# Patient Record
Sex: Female | Born: 1955 | Race: White | Hispanic: No | Marital: Married
Health system: Southern US, Community
[De-identification: ages and names within clinical notes are randomized; demographics above are authoritative.]

---

## 2021-10-28 ENCOUNTER — Inpatient Hospital Stay
Admission: AD | Admit: 2021-10-28 | Discharge: 2021-11-24 | Disposition: A | Discharge status: Skilled Nursing Facility | Payer: Medicare Other | Source: Other Acute Inpatient Hospital

## 2021-10-28 ENCOUNTER — Other Ambulatory Visit (HOSPITAL_COMMUNITY): Payer: Medicare Other

## 2021-10-28 DIAGNOSIS — J90 Pleural effusion, not elsewhere classified: Secondary | ICD-10-CM

## 2021-10-28 DIAGNOSIS — J9621 Acute and chronic respiratory failure with hypoxia: Secondary | ICD-10-CM

## 2021-10-28 DIAGNOSIS — Z931 Gastrostomy status: Secondary | ICD-10-CM

## 2021-10-28 DIAGNOSIS — Z930 Tracheostomy status: Secondary | ICD-10-CM

## 2021-10-28 DIAGNOSIS — G061 Intraspinal abscess and granuloma: Secondary | ICD-10-CM

## 2021-10-28 DIAGNOSIS — J9690 Respiratory failure, unspecified, unspecified whether with hypoxia or hypercapnia: Secondary | ICD-10-CM

## 2021-10-28 DIAGNOSIS — J449 Chronic obstructive pulmonary disease, unspecified: Secondary | ICD-10-CM

## 2021-10-28 DIAGNOSIS — Z4659 Encounter for fitting and adjustment of other gastrointestinal appliance and device: Secondary | ICD-10-CM

## 2021-10-28 LAB — VANCOMYCIN, TROUGH: Vancomycin Tr: 21 ug/mL (ref 15–20)

## 2021-10-28 IMAGING — DX DG CHEST 1V PORT
1 series · 1 of 1 positions shown · non-contrast
Comparison: None.

CLINICAL DATA: Respiratory failure.

EXAM:
PORTABLE CHEST 1 VIEW

[chest ap]
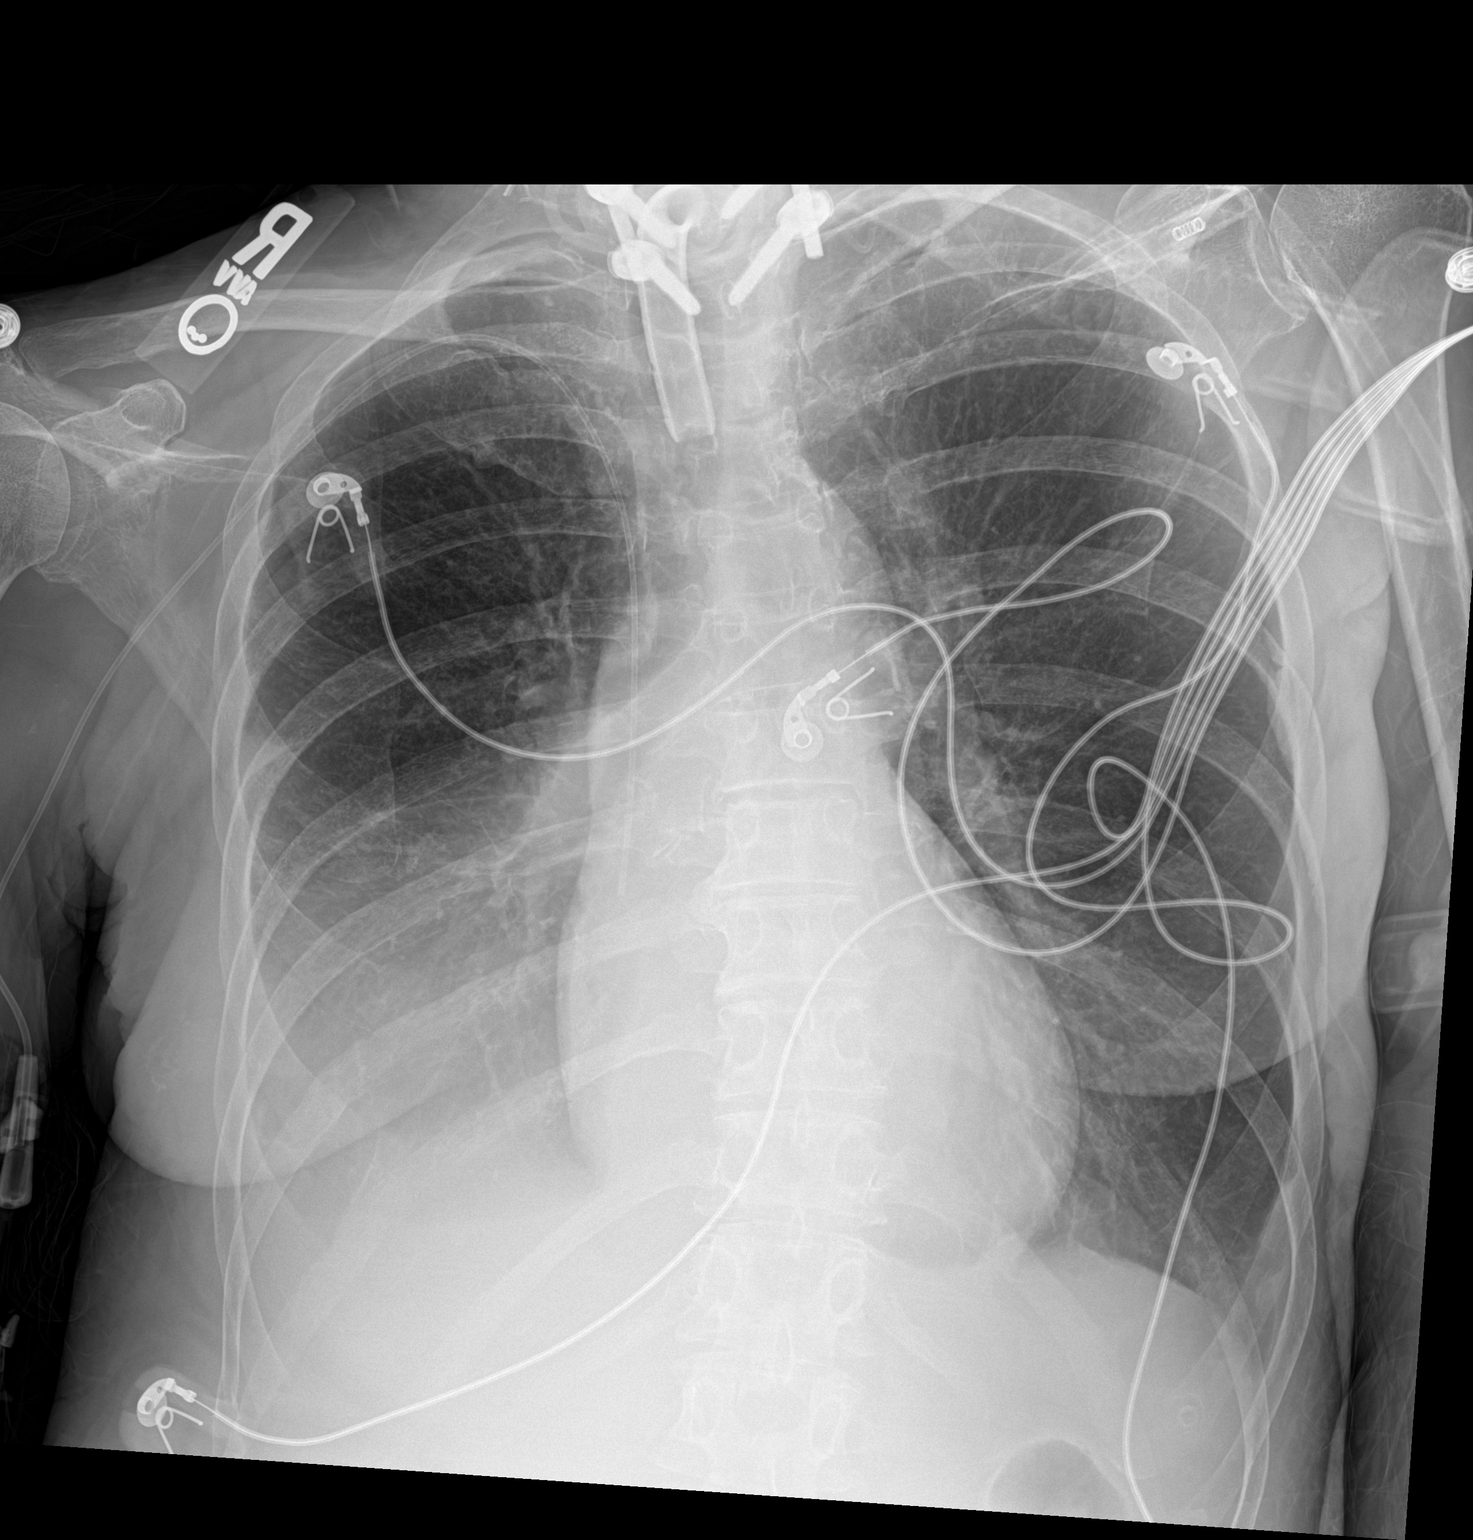

[1 of 1 positions shown; findings below may reference images not displayed]

FINDINGS: Right upper extremity PICC terminates over the distal SVC. The tip
of the tracheostomy is 4 cm above the carina. There is a small left
and small to moderate size right pleural effusion. The lungs are
otherwise clear. There is no pneumothorax. Cardiomediastinal
silhouette is within normal limits. There are surgical changes in
the cervical spine and neck.
IMPRESSION: 1. Small left and small/moderate right pleural effusions.

## 2021-10-28 MED ORDER — DIATRIZOATE MEGLUMINE & SODIUM 66-10 % PO SOLN
ORAL | Status: AC
  Administered 2021-10-28: 30 mL via GASTROSTOMY
  Filled 2021-10-28: qty 30

## 2021-10-29 DIAGNOSIS — J449 Chronic obstructive pulmonary disease, unspecified: Secondary | ICD-10-CM | POA: Diagnosis not present

## 2021-10-29 DIAGNOSIS — J9621 Acute and chronic respiratory failure with hypoxia: Secondary | ICD-10-CM | POA: Diagnosis not present

## 2021-10-29 DIAGNOSIS — J90 Pleural effusion, not elsewhere classified: Secondary | ICD-10-CM

## 2021-10-29 DIAGNOSIS — G061 Intraspinal abscess and granuloma: Secondary | ICD-10-CM

## 2021-10-29 DIAGNOSIS — Z930 Tracheostomy status: Secondary | ICD-10-CM

## 2021-10-29 LAB — COMPREHENSIVE METABOLIC PANEL
ALT: 21 U/L (ref 0–44)
AST: 21 U/L (ref 15–41)
Albumin: 1.8 g/dL — ABNORMAL LOW (ref 3.5–5.0)
Alkaline Phosphatase: 60 U/L (ref 38–126)
Anion gap: 4 — ABNORMAL LOW (ref 5–15)
BUN: 34 mg/dL — ABNORMAL HIGH (ref 8–23)
CO2: 30 mmol/L (ref 22–32)
Calcium: 8.9 mg/dL (ref 8.9–10.3)
Chloride: 111 mmol/L (ref 98–111)
Creatinine, Ser: 0.45 mg/dL (ref 0.44–1.00)
GFR, Estimated: >60 mL/min (ref >60)
Glucose, Bld: 120 mg/dL — ABNORMAL HIGH (ref 70–99)
Potassium: 3.9 mmol/L (ref 3.5–5.1)
Sodium: 145 mmol/L (ref 135–145)
Total Bilirubin: 0.3 mg/dL (ref 0.3–1.2)
Total Protein: 5.7 g/dL — ABNORMAL LOW (ref 6.5–8.1)

## 2021-10-29 LAB — CBC WITH DIFFERENTIAL/PLATELET
Abs Immature Granulocytes: 0.02 10*3/uL (ref 0.00–0.07)
Basophils Absolute: 0 10*3/uL (ref 0.0–0.1)
Basophils Relative: 1 %
Eosinophils Absolute: 0.3 10*3/uL (ref 0.0–0.5)
Eosinophils Relative: 5 %
HCT: 31.4 % — ABNORMAL LOW (ref 36.0–46.0)
Hemoglobin: 9.8 g/dL — ABNORMAL LOW (ref 12.0–15.0)
Immature Granulocytes: 0 %
Lymphocytes Relative: 9 %
Lymphs Abs: 0.5 10*3/uL — ABNORMAL LOW (ref 0.7–4.0)
MCH: 30.9 pg (ref 26.0–34.0)
MCHC: 31.2 g/dL (ref 30.0–36.0)
MCV: 99.1 fL (ref 80.0–100.0)
Monocytes Absolute: 0.5 10*3/uL (ref 0.1–1.0)
Monocytes Relative: 9 %
Neutro Abs: 3.9 10*3/uL (ref 1.7–7.7)
Neutrophils Relative %: 76 %
Platelets: 185 10*3/uL (ref 150–400)
RBC: 3.17 MIL/uL — ABNORMAL LOW (ref 3.87–5.11)
RDW: 17.2 % — ABNORMAL HIGH (ref 11.5–15.5)
WBC: 5.2 10*3/uL (ref 4.0–10.5)
nRBC: 0 % (ref 0.0–0.2)

## 2021-10-29 LAB — TSH: TSH: 2.546 u[IU]/mL (ref 0.350–4.500)

## 2021-10-29 NOTE — Consult Note (Signed)
[facility] [place]  Date of Service: 10/29/2021  PULMONARY CRITICAL CARE CONSULT   Lori LeanJanet Laverdure Kemp  YQI:[id number]  DOB: [birth year]   DOA: 10/28/2021  Referring Physician: Luna KitchensStephanie Brown, MD  HPI: Lori Kemp is a 66 y.o. female seen for follow up of Acute on Chronic Respiratory Failure.  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  Patient was taken to the OR on November 26 with a C2-7 laminectomy and occiput to T2 posterior fusion.  Patient November 29 had a C3-C4 ACDF with evacuation of an epidural abscess.  Patient's hospital course was very complicated and required ongoing mechanical ventilation was not able to come off the ventilator so transferred to our facility for further management.  At the time of evaluation patient is on assist control with an FiO2 of 40%.  Patient apparently had a right vertebral artery injury which was unchanged but there was adequate collateral circulation.  As far as the infection is concerned patient was treated with vancomycin cefepime Flagyl  Review of Systems:  ROS performed and is unremarkable other than noted above.  Past Medical History: Past Medical History:  Diagnosis Date   Alcoholism (CMS-HCC)   Anxiety   Arthritis   Cancer (CMS-HCC)   COPD (chronic obstructive pulmonary disease) (CMS-HCC)   Depression   Emphysema of lung (CMS-HCC) 2021   Feeding by G-tube (CMS-HCC)   GERD (gastroesophageal reflux disease)   Headache   History of smoking 25-50 pack years   Hx of Lymphoma of breast (CMS-HCC)   Hypocalciuria   Low calcium levels   Lymphoma (CMS-HCC)  basal cell   Macular hole, left   Oropharyngeal cancer (CMS-HCC)  hx 2020   Osteoporosis   Peptic  ulceration 1995   Pneumonia 11/2019   Sleep apnea  doesn't use a CPAP   Stroke (CMS-HCC)  about 13 yrs ago, "mild"   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)   Surgical History: Past Surgical History:  Procedure Laterality Date   ADENOIDECTOMY   COLONOSCOPY   DILATION AND CURETTAGE OF UTERUS   ESOPHAGOGASTRODUODENOSCOPY   IR INSERT G-TUBE PERCUTANEOUS 02/11/2019  IR INSERT G-TUBE PERCUTANEOUS 02/11/2019 Rush BarerJessica Kelly Stewart, MD IMG [facility]IR [facility]   IR INSERT G-TUBE PERCUTANEOUS 08/21/2019  IR INSERT G-TUBE PERCUTANEOUS 08/21/2019 Braulio Contelayton Warren Commander, MD IMG [facility]   PORT A CATH PLACEMENT [facility] HISTORICAL RESULT)   PORT A CATH REMOVAL [facility] HISTORICAL RESULT)   PR ADJ TISS XFER HEAD,FAC,HAND <10 SQCM Left 07/12/2021  Procedure: ADJACENT TRANSF CHIN/NECK/AX/FT; 10 SQ CM/LESS; Surgeon: Karrie MeresWendell Gray Yarbrough, MD; [facility]; Service: ENT   PR ALLOGRAFT FOR SPINE SURGERY ONLY MORSELIZED N/A 09/02/2021  Procedure: Jena GaussALLOGRAFT FOR SPINE SURGERY ONLY; MORSELIZED; Surgeon: Bretta BangBrian David Sindelar, MD; Location: MAIN OR [facility]; Service: Neurosurgery   PR ARTHRD PST/PSTLAT TQ 1NTRSPC CRV BELW C2 SEGMENT N/A 09/02/2021  Procedure: ARTHRODESIS, POSTERIOR OR POSTEROLATERAL TECHNIQUE, SINGLE INTERSPACE; CERVICAL BELOW C2 SEGMENT; Surgeon: Bretta BangBrian David Sindelar, MD; [facility]; Service: Neurosurgery   PR ARTHRODESIS ANT INTERBODY MIN DISCECTOMY,EA ADDL N/A 09/06/2021  Procedure: ARTHRODESIS, ANTERIOR INTERBODY TECHNIQUE, INCLUD MINIMAL DISKECTOMY TO PREP INTERSPAC; EA ADD`L INTERSPACE CERVICAL; Surgeon: Bretta BangBrian David Sindelar, MD; [facility]; Service: Neurosurgery   PR ARTHRODESIS POSTERIOR CRANIOCERVICAL N/A 09/02/2021  Procedure: ARTHRODESIS, POSTERIOR TECHNIQUE, CRANIOCERVICAL (  OCCIPUT-C2); Surgeon: Bretta Bang, MD; Location: [facility] OR [facility]; Service: Neurosurgery   PR ARTHRODESIS PST/PSTLAT TQ 1NTRSPC EA ADDL NTRSPC N/A 09/02/2021  Procedure: ARTHRODESIS,  POSTERIOR OR POSTEROLATERAL TECHNIQUE, SINGLE INTERSPACE; EACH ADDITIONAL INTERSPACE (LIST SEPARATELY IN ADDITION TO CODE FOR PRIMARY PROCEDURE); Surgeon: Bretta Bang, MD; Location[facility] OR [facility]; Service: Neurosurgery   PR AUTOGRAFT SPINE SURGERY LOCAL FROM SAME INCISION N/A 09/02/2021  Procedure: AUTOGRAFT/SPINE SURG ONLY (W/HARVEST GRAFT); LOCAL (EG, RIB/SPINOUS PROC, LAM FRGMT) OBTAIN FROM SAME INCIS; Surgeon: Bretta Bang, MD; Location: [facility] OR [facility]; Service: Neurosurgery   PR BRONCHOSCOPY,DIAGNOSTIC N/A 08/19/2019  Procedure: BRONCHOSCOPY, RIGID OR FLEXIBLE, W/WO FLUOROSCOPIC GUIDANCE; DIAGNOSTIC, WITH CELL WASHING, WHEN PERFORMED; Surgeon: Karrie Meres, MD; Location: MAIN OR [facility]; Service: ENT   PR ESOPHAGOSCOPY,DIAGNOSTIC N/A 08/19/2019  Procedure: ESOPHAGOSCOPY, RIGID OR FLEXIBLE; DIAGNOSTIC, W/WO COLLECTION OF SPECIMEN(S) BY BRUSHING OR WASHING; Surgeon: Karrie Meres, MD; Location: MAIN OR [facility]; Service: ENT   PR EXPLORATION N/FLWD SURG NECK ARTERY N/A 09/09/2019  Procedure: EXPLORATION NOT FOLLOWED BY SURGICAL REPAIR, ARTERY; NECK (EG, CAROTID, SUBCLAVIAN); Surgeon: Biagio Quint, MD; Location: MAIN OR [facility]; Service: ENT   PR FREE SKIN FLAP W MICROVASC ANAST N/A 09/09/2019  Procedure: FREE SKIN FLAP WITH MICROVASCULAR ANASTOMOSIS; Surgeon: Biagio Quint, MD; Location: [facility] OR [facility]; Service: ENT   PR LAM FACETECTOMY&FORAMOT 1 VRT SGM EA ADDL SGM N/A 09/02/2021  Procedure: LAMINECTOMY, FACETECTOMY AND FORAMINOTOMY (UNI/BI W DECOMP OF SPINAL CORD, CAUDA EQUINA AND/ NERVE ROOT'S), SNGLE VERTEBRAL SEG; EACH ADDTL VERTEBRAL SEG, CERVICAL, THORACIC, OR LUM (ADD TO PRIM PROC); Surgeon: Bretta Bang, MD; Location: [facility] OR [facility]; Service: Neurosurgery   PR LAMINEC/FACETECT/FORAMIN,CERVICAL 1 SEG N/A 09/02/2021  Procedure: LAMINECTOMY SNGL VERTEBRAL SEGMT-UNI/BIL; CERV; Surgeon: Bretta Bang, MD; Location: MAIN OR [facility]; Service:  Neurosurgery   PR LARYNGOSCOPY,DIRCT,OP SCOPE,BIOPSY N/A 08/19/2019  Procedure: LARYNGOSCOPY, DIRECT, OPERATIVE, WITH BIOPSY; WITH OPERATING MICROSCOPE OR TELESCOPE; Surgeon: Karrie Meres, MD; Location: MAIN OR [facility]; Service: ENT   PR LARYNGOSCOPY,DIRCT,OP SCOPE,BIOPSY N/A 09/09/2019  Procedure: LARYNGOSCOPY, DIRECT, OPERATIVE, WITH BIOPSY; WITH OPERATING MICROSCOPE OR TELESCOPE; Surgeon: Karrie Meres, MD; Location: [facility]; Service: ENT   PR PART REMOVAL TONGUE,<1/2 Left 07/12/2021  Procedure: ROBOTIC GLOSSECTOMY; LESS THAN ONE-HALF TONGUE; Surgeon: Karrie Meres, MD; Location: MAIN OR [facility]; Service: ENT   PR PARTIAL REMOVAL OF PHARYNX Left 07/12/2021  Procedure: LTD PHARYNGECTOMY; Surgeon: Karrie Meres, MD; Location: MAIN OR [facility]; Service: ENT   PR POSTERIOR SEGMENTAL INSTRUMENTATION 7-12 VRT SEG N/A 09/02/2021  Procedure: POSTERIOR SEGMENTAL INSTRUMENTATION; (EG, PEDICLE FIXATION, DUAL RODS W/MULT HOOKS/WIRES) 7-12 VERTEB SEGMT; Surgeon: Bretta Bang, MD; Location: [facility] OR [facility]; Service: Neurosurgery   PR REMOVAL NODES, NECK,CERV MOD RAD Right 09/09/2019  Procedure: CERVICAL LYMPHADENECTOMY (MODIFIED RADICAL NECK DISSECTION); Surgeon: Karrie Meres, MD; Location: [facility]; Service: ENT   PR REPAIR THROAT, ESOPHAGUS N/A 09/09/2019  Procedure: PHARYNGOESOPHAGEAL REPR; Surgeon: Biagio Quint, MD; Location[facility]; Service: ENT   PR RESEC PHARYN Vladimir Creeks MYOCUT FLAP N/A 09/09/2019  Procedure: RESECT PHARYNGEAL WALL REQ CLOSURE W/MYOCUTANEOUS OR FASCIOCUTANEOUS FLAP OR FREE MUSCLE/SKIN/FASCIAL FLAP; Surgeon: Karrie Meres, MD; Location: MAIN OR [facility]; Service: ENT   PR [facility] TUBE CHANGE 09/09/2019  Procedure: Tracheotomy Tube Change Befor Estab Fistula Trac; Surgeon: Johnsie Cancel, MD; Location: [facility] OR [facility]; Service: ENT   PR TRACHEOSTOMY, PLANNED N/A 09/09/2019  Procedure: Tracheostomy Planned (Separt  Proc); Surgeon: Johnsie Cancel, MD; Location: [facility]; Service: ENT   PR TRANSECT  OTHER CRAN [facility],EXTRADURAL Right 09/09/2019  Procedure: Transect/Avulsion Other Cranial Nerv Extradural; Surgeon: Johnsie CancelAndrew J Coniglio, MD; [facility]; Service: ENT   PR VITRECTOMY PARS PLANA REMOVE INT MEMB RETINA Left 08/29/2021  Procedure: 25 gauge pars plana vitrectomy, membrane removal, macular hole repair, and fluid/gas exchange; Surgeon: Lawerance SabalStephen S Scott, MD; [facility]; Service: Ophthalmology   TONSILLECTOMY   Family History: Family History  Problem Relation Age of Onset   Cancer Father    Allergies: Cephalexin and Latex  Social History: Social History   Tobacco Use   Smoking status: Some Days  Packs/day: 0.25  Years: 50.00  Pack years: 12.50  Types: Cigarettes   Smokeless tobacco: Never  Vaping Use   Vaping Use: Never used  Substance Use Topics   Alcohol use: Not Currently  Comment: stopped in feb 2020   Drug use: Never   Medications: Reviewed on Rounds  Physical Exam:  Vitals: Temperature is 97.6 pulse 92 respiratory 22 blood pressure is 143/81 saturations 99%  Ventilator Settings assist-control FiO2 is 40% tidal volume 367 PEEP 5  General: Comfortable at this time Eyes: Grossly normal lids, irises & conjunctiva ENT: grossly tongue is normal Neck: no obvious mass Cardiovascular: S1-S2 normal no gallop or rub Respiratory: No rhonchi very coarse breath sounds Abdomen: Soft and nontender Skin: no rash seen on limited exam Musculoskeletal: not rigid Psychiatric:unable to assess Neurologic: no seizure no involuntary movements         Labs on Admission:  Basic Metabolic Panel: Recent Labs  Lab 10/29/21 0324  NA 145  K 3.9  CL 111  CO2 30  GLUCOSE 120*  BUN 34*  CREATININE 0.45  CALCIUM 8.9    No results for input(s): PHART, PCO2ART, PO2ART, HCO3, O2SAT in the last 168 hours.  Liver Function Tests: Recent Labs  Lab 10/29/21 0324  AST 21   ALT 21  ALKPHOS 60  BILITOT 0.3  PROT 5.7*  ALBUMIN 1.8*   No results for input(s): LIPASE, AMYLASE in the last 168 hours. No results for input(s): AMMONIA in the last 168 hours.  CBC: Recent Labs  Lab 10/29/21 0324  WBC 5.2  NEUTROABS 3.9  HGB 9.8*  HCT 31.4*  MCV 99.1  PLT 185    Cardiac Enzymes: No results for input(s): CKTOTAL, CKMB, CKMBINDEX, TROPONINI in the last 168 hours.  BNP (last 3 results) No results for input(s): BNP in the last 8760 hours.  ProBNP (last 3 results) No results for input(s): PROBNP in the last 8760 hours.   Radiological Exams on Admission: DG ABDOMEN PEG TUBE LOCATION  Result Date: 10/28/2021 CLINICAL DATA:  Evaluate PEG tube placement. EXAM: ABDOMEN - 1 VIEW COMPARISON:  None. FINDINGS: The bowel gas pattern is normal. A percutaneous gastrostomy tube is seen with its distal tip overlying the body of the stomach. Radiopaque contrast is seen within the body of the stomach and duodenal bulb. There is no evidence of contrast extravasation. No radio-opaque calculi or other significant radiographic abnormality are seen. IMPRESSION: Percutaneous gastrostomy tube positioning, as described above. Electronically Signed   By: Aram Candelahaddeus  Houston M.D.   On: 10/28/2021 21:51   DG Chest Port 1 View  Result Date: 10/28/2021 CLINICAL DATA:  Respiratory failure. EXAM: PORTABLE CHEST 1 VIEW COMPARISON:  None. FINDINGS: Right upper extremity PICC terminates over the distal SVC. The tip of the tracheostomy is 4 cm above the carina. There is a small left and small to moderate size right pleural effusion. The lungs are otherwise clear.  There is no pneumothorax. Cardiomediastinal silhouette is within normal limits. There are surgical changes in the cervical spine and neck. IMPRESSION: 1. Small left and small/moderate right pleural effusions. Electronically Signed   By: Darliss Cheney M.D.   On: 10/28/2021 19:42    Assessment/Plan Active Problems:   Acute on chronic  respiratory failure with hypoxia (HCC)   Abscess in epidural space of cervical spine   Tracheostomy status (HCC)   COPD, severe (HCC)   Bilateral pleural effusion   Acute on chronic respiratory failure with hypoxia patient remains ventilator dependent on full support assist control mode is right now on 40% FiO2 volumes look okay spoke with respiratory therapy during rounds to try to assess for wean readiness with weaning attempt Epidural abscess cervical spine with complications patient has been treated with antibiotics we will need to continue with the c-collar as per recommendations of the neurosurgery team that transferred the patient here Tracheostomy status patient will keep the tracheostomy in place will need long-term mechanical ventilation expected Severe COPD medical management we will continue to monitor closely. Pleural effusions we will continue to follow the patient's effusions right now without significant  I have personally seen and evaluated the patient, evaluated laboratory and imaging results, formulated the assessment and plan and placed orders. The Patient requires high complexity decision making with multiple systems involvement.  Case was discussed on Rounds with the Respiratory Therapy Director and the Respiratory staff Time Spent  Yevonne Pax, MD [facility] Pulmonary Critical Care Medicine Sleep Medicine

## 2021-10-31 DIAGNOSIS — J449 Chronic obstructive pulmonary disease, unspecified: Secondary | ICD-10-CM | POA: Diagnosis not present

## 2021-10-31 DIAGNOSIS — G061 Intraspinal abscess and granuloma: Secondary | ICD-10-CM | POA: Diagnosis not present

## 2021-10-31 DIAGNOSIS — J9621 Acute and chronic respiratory failure with hypoxia: Secondary | ICD-10-CM | POA: Diagnosis not present

## 2021-10-31 DIAGNOSIS — J90 Pleural effusion, not elsewhere classified: Secondary | ICD-10-CM | POA: Diagnosis not present

## 2021-10-31 LAB — HEMOGLOBIN A1C
Hgb A1c MFr Bld: 5 % (ref 4.8–5.6)
Mean Plasma Glucose: 97 mg/dL

## 2021-10-31 LAB — VANCOMYCIN, TROUGH: Vancomycin Tr: 16 ug/mL (ref 15–20)

## 2021-10-31 NOTE — Progress Notes (Signed)
Pulmonary Critical Care Medicine [facility] [place]   PULMONARY CRITICAL CARE SERVICE  PROGRESS NOTE     Lori Kemp  ZLD:[id number]  DOB: 1956/06/04   DOA: 10/28/2021  Referring Physician: Luna Kitchens, MD  HPI: Lori Kemp is a 66 y.o. female being followed for ventilator/airway/oxygen weaning Acute on Chronic Respiratory Failure.  Patient is resting comfortably without distress at this time has been on 40% FiO2 on T collar  Medications: Reviewed on Rounds  Physical Exam:  Vitals: Temperature 97.4 pulse 89 respiratory rate is 19  Ventilator Settings off the ventilator on T collar on 40% FiO2 and  General: Comfortable at this time Neck: supple Cardiovascular: no malignant arrhythmias Respiratory: Scattered rhonchi expansion is equal Skin: no rash seen on limited exam Musculoskeletal: No gross abnormality Psychiatric:unable to assess Neurologic:no involuntary movements         Lab Data:   Basic Metabolic Panel: Recent Labs  Lab 10/29/21 0324  NA 145  K 3.9  CL 111  CO2 30  GLUCOSE 120*  BUN 34*  CREATININE 0.45  CALCIUM 8.9    ABG: No results for input(s): PHART, PCO2ART, PO2ART, HCO3, O2SAT in the last 168 hours.  Liver Function Tests: Recent Labs  Lab 10/29/21 0324  AST 21  ALT 21  ALKPHOS 60  BILITOT 0.3  PROT 5.7*  ALBUMIN 1.8*   No results for input(s): LIPASE, AMYLASE in the last 168 hours. No results for input(s): AMMONIA in the last 168 hours.  CBC: Recent Labs  Lab 10/29/21 0324  WBC 5.2  NEUTROABS 3.9  HGB 9.8*  HCT 31.4*  MCV 99.1  PLT 185    Cardiac Enzymes: No results for input(s): CKTOTAL, CKMB, CKMBINDEX, TROPONINI in the last 168 hours.  BNP (last 3 results) No results for input(s): BNP in the last 8760 hours.  ProBNP (last 3 results) No results for input(s): PROBNP in the last 8760 hours.  Radiological Exams: No results found.  Assessment/Plan Active Problems:   Acute on chronic  respiratory failure with hypoxia (HCC)   Abscess in epidural space of cervical spine   Tracheostomy status (HCC)   COPD, severe (HCC)   Bilateral pleural effusion   Acute on chronic respiratory failure hypoxia we will continue with T-piece goal of 8 hours Cervical spine abscess continue to monitor along closely. Tracheostomy remains in place Severe COPD medical management Bilateral pleural effusions we will monitor x-rays closely   I have personally seen and evaluated the patient, evaluated laboratory and imaging results, formulated the assessment and plan and placed orders. The Patient requires high complexity decision making with multiple systems involvement.  Rounds were done with the Respiratory Therapy Director and Staff therapists and discussed with nursing staff also.  Yevonne Pax, MD [facility] Pulmonary Critical Care Medicine Sleep Medicine

## 2021-11-01 DIAGNOSIS — J9621 Acute and chronic respiratory failure with hypoxia: Secondary | ICD-10-CM | POA: Diagnosis not present

## 2021-11-01 DIAGNOSIS — J449 Chronic obstructive pulmonary disease, unspecified: Secondary | ICD-10-CM | POA: Diagnosis not present

## 2021-11-01 DIAGNOSIS — G061 Intraspinal abscess and granuloma: Secondary | ICD-10-CM | POA: Diagnosis not present

## 2021-11-01 DIAGNOSIS — J90 Pleural effusion, not elsewhere classified: Secondary | ICD-10-CM | POA: Diagnosis not present

## 2021-11-01 NOTE — Progress Notes (Signed)
Pulmonary Critical Care Medicine [facility] [place]   PULMONARY CRITICAL CARE SERVICE  PROGRESS NOTE     Lori Kemp  IHW:[id number]  DOB: 06/08/1956   DOA: 10/28/2021  Referring Physician: Luna Kitchens, MD  HPI: Lori Kemp is a 66 y.o. female being followed for ventilator/airway/oxygen weaning Acute on Chronic Respiratory Failure.  Afebrile without distress has been on pressure support goal is for 8 hours  Medications: Reviewed on Rounds  Physical Exam:  Vitals: Temperature is 97.3 pulse 91 respiratory rate is 26 blood pressure 114/76 saturations 100%  Ventilator Settings on pressure support FiO2 is 40% pressure 12/5  General: Comfortable at this time Neck: supple Cardiovascular: no malignant arrhythmias Respiratory: No rhonchi no rales noted at this time Skin: no rash seen on limited exam Musculoskeletal: No gross abnormality Psychiatric:unable to assess Neurologic:no involuntary movements         Lab Data:   Basic Metabolic Panel: Recent Labs  Lab 10/29/21 0324  NA 145  K 3.9  CL 111  CO2 30  GLUCOSE 120*  BUN 34*  CREATININE 0.45  CALCIUM 8.9    ABG: No results for input(s): PHART, PCO2ART, PO2ART, HCO3, O2SAT in the last 168 hours.  Liver Function Tests: Recent Labs  Lab 10/29/21 0324  AST 21  ALT 21  ALKPHOS 60  BILITOT 0.3  PROT 5.7*  ALBUMIN 1.8*   No results for input(s): LIPASE, AMYLASE in the last 168 hours. No results for input(s): AMMONIA in the last 168 hours.  CBC: Recent Labs  Lab 10/29/21 0324  WBC 5.2  NEUTROABS 3.9  HGB 9.8*  HCT 31.4*  MCV 99.1  PLT 185    Cardiac Enzymes: No results for input(s): CKTOTAL, CKMB, CKMBINDEX, TROPONINI in the last 168 hours.  BNP (last 3 results) No results for input(s): BNP in the last 8760 hours.  ProBNP (last 3 results) No results for input(s): PROBNP in the last 8760 hours.  Radiological Exams: No results found.  Assessment/Plan Active Problems:    Acute on chronic respiratory failure with hypoxia (HCC)   Abscess in epidural space of cervical spine   Tracheostomy status (HCC)   COPD, severe (HCC)   Bilateral pleural effusion   Acute on chronic respiratory failure hypoxia patient goal is for 8 hours on pressure support wean 12/5 Epidural abscess supportive care prognosis is guarded Severe COPD nebulizers as needed Bilateral pleural effusions we will continue to monitor Tracheostomy remains in place   I have personally seen and evaluated the patient, evaluated laboratory and imaging results, formulated the assessment and plan and placed orders. The Patient requires high complexity decision making with multiple systems involvement.  Rounds were done with the Respiratory Therapy Director and Staff therapists and discussed with nursing staff also.  Yevonne Pax, MD [facility] Pulmonary Critical Care Medicine Sleep Medicine

## 2021-11-02 DIAGNOSIS — J449 Chronic obstructive pulmonary disease, unspecified: Secondary | ICD-10-CM | POA: Diagnosis not present

## 2021-11-02 DIAGNOSIS — G061 Intraspinal abscess and granuloma: Secondary | ICD-10-CM | POA: Diagnosis not present

## 2021-11-02 DIAGNOSIS — J9621 Acute and chronic respiratory failure with hypoxia: Secondary | ICD-10-CM | POA: Diagnosis not present

## 2021-11-02 DIAGNOSIS — J90 Pleural effusion, not elsewhere classified: Secondary | ICD-10-CM | POA: Diagnosis not present

## 2021-11-02 LAB — BASIC METABOLIC PANEL
Anion gap: 4 — ABNORMAL LOW (ref 5–15)
BUN: 28 mg/dL — ABNORMAL HIGH (ref 8–23)
CO2: 33 mmol/L — ABNORMAL HIGH (ref 22–32)
Calcium: 8.8 mg/dL — ABNORMAL LOW (ref 8.9–10.3)
Chloride: 100 mmol/L (ref 98–111)
Creatinine, Ser: 0.46 mg/dL (ref 0.44–1.00)
GFR, Estimated: >60 mL/min (ref >60)
Glucose, Bld: 125 mg/dL — ABNORMAL HIGH (ref 70–99)
Potassium: 4.8 mmol/L (ref 3.5–5.1)
Sodium: 137 mmol/L (ref 135–145)

## 2021-11-02 LAB — CBC
HCT: 29.8 % — ABNORMAL LOW (ref 36.0–46.0)
Hemoglobin: 8.9 g/dL — ABNORMAL LOW (ref 12.0–15.0)
MCH: 29.6 pg (ref 26.0–34.0)
MCHC: 29.9 g/dL — ABNORMAL LOW (ref 30.0–36.0)
MCV: 99 fL (ref 80.0–100.0)
Platelets: 194 10*3/uL (ref 150–400)
RBC: 3.01 MIL/uL — ABNORMAL LOW (ref 3.87–5.11)
RDW: 15.9 % — ABNORMAL HIGH (ref 11.5–15.5)
WBC: 5.4 10*3/uL (ref 4.0–10.5)
nRBC: 0 % (ref 0.0–0.2)

## 2021-11-02 LAB — MAGNESIUM: Magnesium: 1.8 mg/dL (ref 1.7–2.4)

## 2021-11-02 NOTE — Progress Notes (Signed)
Pulmonary Critical Care Medicine [facility] [place]   PULMONARY CRITICAL CARE SERVICE  PROGRESS NOTE     Lori Kemp  BZJ:[id number]  DOB: 23-Jul-1956   DOA: 10/28/2021  Referring Physician: Luna Kitchens, MD  HPI: Lori Kemp is a 66 y.o. female being followed for ventilator/airway/oxygen weaning Acute on Chronic Respiratory Failure.  Patient is comfortable without distress at this time has been on T-piece on 35% FiO2  Medications: Reviewed on Rounds  Physical Exam:  Vitals: Temperature is 97.9 pulse 105 respiratory rate is 22 blood pressure is 131/72 saturations 99%  Ventilator Settings on T collar on 35% FiO2  General: Comfortable at this time Neck: supple Cardiovascular: no malignant arrhythmias Respiratory: No rhonchi very coarse breath sounds Skin: no rash seen on limited exam Musculoskeletal: No gross abnormality Psychiatric:unable to assess Neurologic:no involuntary movements         Lab Data:   Basic Metabolic Panel: Recent Labs  Lab 10/29/21 0324 11/02/21 0614  NA 145 137  K 3.9 4.8  CL 111 100  CO2 30 33*  GLUCOSE 120* 125*  BUN 34* 28*  CREATININE 0.45 0.46  CALCIUM 8.9 8.8*  MG  --  1.8    ABG: No results for input(s): PHART, PCO2ART, PO2ART, HCO3, O2SAT in the last 168 hours.  Liver Function Tests: Recent Labs  Lab 10/29/21 0324  AST 21  ALT 21  ALKPHOS 60  BILITOT 0.3  PROT 5.7*  ALBUMIN 1.8*   No results for input(s): LIPASE, AMYLASE in the last 168 hours. No results for input(s): AMMONIA in the last 168 hours.  CBC: Recent Labs  Lab 10/29/21 0324 11/02/21 0614  WBC 5.2 5.4  NEUTROABS 3.9  --   HGB 9.8* 8.9*  HCT 31.4* 29.8*  MCV 99.1 99.0  PLT 185 194    Cardiac Enzymes: No results for input(s): CKTOTAL, CKMB, CKMBINDEX, TROPONINI in the last 168 hours.  BNP (last 3 results) No results for input(s): BNP in the last 8760 hours.  ProBNP (last 3 results) No results for input(s): PROBNP in the last  8760 hours.  Radiological Exams: No results found.  Assessment/Plan Active Problems:   Acute on chronic respiratory failure with hypoxia (HCC)   Abscess in epidural space of cervical spine   Tracheostomy status (HCC)   COPD, severe (HCC)   Bilateral pleural effusion   Acute on chronic respiratory failure hypoxia plan is to continue with the T-piece titrate oxygen down as tolerated Epidural abscess has been treated we will continue with supportive care prognosis guarded Severe COPD medical management Bilateral effusions we will continue to monitor Tracheostomy remains in place for airway protection   I have personally seen and evaluated the patient, evaluated laboratory and imaging results, formulated the assessment and plan and placed orders. The Patient requires high complexity decision making with multiple systems involvement.  Rounds were done with the Respiratory Therapy Director and Staff therapists and discussed with nursing staff also.  Yevonne Pax, MD [facility] Pulmonary Critical Care Medicine Sleep Medicine

## 2021-11-03 DIAGNOSIS — J90 Pleural effusion, not elsewhere classified: Secondary | ICD-10-CM | POA: Diagnosis not present

## 2021-11-03 DIAGNOSIS — J449 Chronic obstructive pulmonary disease, unspecified: Secondary | ICD-10-CM | POA: Diagnosis not present

## 2021-11-03 DIAGNOSIS — J9621 Acute and chronic respiratory failure with hypoxia: Secondary | ICD-10-CM | POA: Diagnosis not present

## 2021-11-03 DIAGNOSIS — G061 Intraspinal abscess and granuloma: Secondary | ICD-10-CM | POA: Diagnosis not present

## 2021-11-03 NOTE — Progress Notes (Signed)
Pulmonary Critical Care Medicine [facility] [place]   PULMONARY CRITICAL CARE SERVICE  PROGRESS NOTE     Lori Kemp  OZH:[id number]  DOB: 07-10-1956   DOA: 10/28/2021  Referring Physician: Luna Kitchens, MD  HPI: Lori Kemp is a 66 y.o. female being followed for ventilator/airway/oxygen weaning Acute on Chronic Respiratory Failure.  Patient is on assist control mode supposed to try for T collar once again  Medications: Reviewed on Rounds  Physical Exam:  Vitals: Temperature is 97.1 pulse 94 respiratory 18 blood pressure is 128/71 saturations 99%  Ventilator Settings on assist control FiO2 28% tidal volume 380 PEEP 5  General: Comfortable at this time Neck: supple Cardiovascular: no malignant arrhythmias Respiratory: Scattered rhonchi expansion is equal Skin: no rash seen on limited exam Musculoskeletal: No gross abnormality Psychiatric:unable to assess Neurologic:no involuntary movements         Lab Data:   Basic Metabolic Panel: Recent Labs  Lab 10/29/21 0324 11/02/21 0614  NA 145 137  K 3.9 4.8  CL 111 100  CO2 30 33*  GLUCOSE 120* 125*  BUN 34* 28*  CREATININE 0.45 0.46  CALCIUM 8.9 8.8*  MG  --  1.8    ABG: No results for input(s): PHART, PCO2ART, PO2ART, HCO3, O2SAT in the last 168 hours.  Liver Function Tests: Recent Labs  Lab 10/29/21 0324  AST 21  ALT 21  ALKPHOS 60  BILITOT 0.3  PROT 5.7*  ALBUMIN 1.8*   No results for input(s): LIPASE, AMYLASE in the last 168 hours. No results for input(s): AMMONIA in the last 168 hours.  CBC: Recent Labs  Lab 10/29/21 0324 11/02/21 0614  WBC 5.2 5.4  NEUTROABS 3.9  --   HGB 9.8* 8.9*  HCT 31.4* 29.8*  MCV 99.1 99.0  PLT 185 194    Cardiac Enzymes: No results for input(s): CKTOTAL, CKMB, CKMBINDEX, TROPONINI in the last 168 hours.  BNP (last 3 results) No results for input(s): BNP in the last 8760 hours.  ProBNP (last 3 results) No results for input(s): PROBNP in  the last 8760 hours.  Radiological Exams: No results found.  Assessment/Plan Active Problems:   Acute on chronic respiratory failure with hypoxia (HCC)   Abscess in epidural space of cervical spine   Tracheostomy status (HCC)   COPD, severe (HCC)   Bilateral pleural effusion   Acute on chronic respiratory failure with hypoxia we will try for T-piece wean once again today Epidural abscess no change we will continue to follow along closely Tracheostomy remains in place Severe COPD medical management Bilateral effusions monitor   I have personally seen and evaluated the patient, evaluated laboratory and imaging results, formulated the assessment and plan and placed orders. The Patient requires high complexity decision making with multiple systems involvement.  Rounds were done with the Respiratory Therapy Director and Staff therapists and discussed with nursing staff also.  Yevonne Pax, MD [facility] Pulmonary Critical Care Medicine Sleep Medicine

## 2021-11-04 DIAGNOSIS — G061 Intraspinal abscess and granuloma: Secondary | ICD-10-CM | POA: Diagnosis not present

## 2021-11-04 DIAGNOSIS — J90 Pleural effusion, not elsewhere classified: Secondary | ICD-10-CM | POA: Diagnosis not present

## 2021-11-04 DIAGNOSIS — J9621 Acute and chronic respiratory failure with hypoxia: Secondary | ICD-10-CM | POA: Diagnosis not present

## 2021-11-04 DIAGNOSIS — J449 Chronic obstructive pulmonary disease, unspecified: Secondary | ICD-10-CM | POA: Diagnosis not present

## 2021-11-04 NOTE — Progress Notes (Signed)
Pulmonary [place]   PULMONARY CRITICAL CARE SERVICE  PROGRESS NOTE     Aleda Carmenate  [id number]  DOB: 07-31-1956   DOA: 10/28/2021  Referring Physician: Satira Sark, MD  HPI: Armanee Nuffer is a 66 y.o. female being followed for ventilator/airway/oxygen weaning Acute on Chronic Respiratory Failure.  Patient is on T-piece on 28% FiO2 the goal is for 20-hour wean  Medications: Reviewed on Rounds  Physical Exam:  Vitals: Temperature is 97.1 pulse 97 respiratory 18 blood pressure is 127/77 saturations 99%  Ventilator Settings on T-piece on 28% FiO2  General: Comfortable at this time Neck: supple Cardiovascular: no malignant arrhythmias Respiratory: No rhonchi no rales are noted at this time Skin: no rash seen on limited exam Musculoskeletal: No gross abnormality Psychiatric:unable to assess Neurologic:no involuntary movements         Lab Data:   Basic Metabolic Panel: Recent Labs  Lab 10/29/21 0324 11/02/21 0614  NA 145 137  K 3.9 4.8  CL 111 100  CO2 30 33*  GLUCOSE 120* 125*  BUN 34* 28*  CREATININE 0.45 0.46  CALCIUM 8.9 8.8*  MG  --  1.8    ABG: No results for input(s): PHART, PCO2ART, PO2ART, HCO3, O2SAT in the last 168 hours.  Liver Function Tests: Recent Labs  Lab 10/29/21 0324  AST 21  ALT 21  ALKPHOS 60  BILITOT 0.3  PROT 5.7*  ALBUMIN 1.8*   No results for input(s): LIPASE, AMYLASE in the last 168 hours. No results for input(s): AMMONIA in the last 168 hours.  CBC: Recent Labs  Lab 10/29/21 0324 11/02/21 0614  WBC 5.2 5.4  NEUTROABS 3.9  --   HGB 9.8* 8.9*  HCT 31.4* 29.8*  MCV 99.1 99.0  PLT 185 194    Cardiac Enzymes: No results for input(s): CKTOTAL, CKMB, CKMBINDEX, TROPONINI in the last 168 hours.  BNP (last 3 results) No results for input(s): BNP in the last 8760 hours.  ProBNP (last 3 results) No results for input(s): PROBNP in the last 8760  hours.  Radiological Exams: No results found.  Assessment/Plan Active Problems:   Acute on chronic respiratory failure with hypoxia (HCC)   Abscess in epidural space of cervical spine   Tracheostomy status (HCC)   COPD, severe (HCC)   Bilateral pleural effusion   Acute on chronic respiratory failure hypoxia we will continue to wean on the T collar goal is 20 hours Epidural abscess no change we will continue to monitor along closely Tracheostomy remains in place Severe COPD medical management Bilateral effusions no change we will continue to monitor.   I have personally seen and evaluated the patient, evaluated laboratory and imaging results, formulated the assessment and plan and placed orders. The Patient requires high complexity decision making with multiple systems involvement.  Rounds were done with the Respiratory Therapy Director and Staff therapists and discussed with nursing staff also.  Allyne Gee, MD [facility] Pulmonary Critical Care Medicine Sleep Medicine

## 2021-11-05 DIAGNOSIS — G061 Intraspinal abscess and granuloma: Secondary | ICD-10-CM | POA: Diagnosis not present

## 2021-11-05 DIAGNOSIS — J449 Chronic obstructive pulmonary disease, unspecified: Secondary | ICD-10-CM | POA: Diagnosis not present

## 2021-11-05 DIAGNOSIS — J90 Pleural effusion, not elsewhere classified: Secondary | ICD-10-CM | POA: Diagnosis not present

## 2021-11-05 DIAGNOSIS — J9621 Acute and chronic respiratory failure with hypoxia: Secondary | ICD-10-CM | POA: Diagnosis not present

## 2021-11-05 LAB — BASIC METABOLIC PANEL
Anion gap: 6 (ref 5–15)
BUN: 32 mg/dL — ABNORMAL HIGH (ref 8–23)
CO2: 36 mmol/L — ABNORMAL HIGH (ref 22–32)
Calcium: 9.2 mg/dL (ref 8.9–10.3)
Chloride: 100 mmol/L (ref 98–111)
Creatinine, Ser: 0.52 mg/dL (ref 0.44–1.00)
GFR, Estimated: >60 mL/min (ref >60)
Glucose, Bld: 129 mg/dL — ABNORMAL HIGH (ref 70–99)
Potassium: 5.3 mmol/L — ABNORMAL HIGH (ref 3.5–5.1)
Sodium: 142 mmol/L (ref 135–145)

## 2021-11-05 LAB — CBC
HCT: 34.1 % — ABNORMAL LOW (ref 36.0–46.0)
Hemoglobin: 10.3 g/dL — ABNORMAL LOW (ref 12.0–15.0)
MCH: 30.4 pg (ref 26.0–34.0)
MCHC: 30.2 g/dL (ref 30.0–36.0)
MCV: 100.6 fL — ABNORMAL HIGH (ref 80.0–100.0)
Platelets: 172 10*3/uL (ref 150–400)
RBC: 3.39 MIL/uL — ABNORMAL LOW (ref 3.87–5.11)
RDW: 15.7 % — ABNORMAL HIGH (ref 11.5–15.5)
WBC: 4.9 10*3/uL (ref 4.0–10.5)
nRBC: 0 % (ref 0.0–0.2)

## 2021-11-05 LAB — MAGNESIUM: Magnesium: 2 mg/dL (ref 1.7–2.4)

## 2021-11-05 LAB — POTASSIUM: Potassium: 5 mmol/L (ref 3.5–5.1)

## 2021-11-05 NOTE — Progress Notes (Signed)
Pulmonary Critical Care Medicine [facility] [place]   PULMONARY CRITICAL CARE SERVICE  PROGRESS NOTE     Lori Kemp  MEQ:[id number]  DOB: [birth year]   DOA: 10/28/2021  Referring Physician: Luna Kitchens, MD  HPI: Lori Kemp is a 66 y.o. female being followed for ventilator/airway/oxygen weaning Acute on Chronic Respiratory Failure.  Patient is afebrile at this time without distress has been on assist control mode supposed to do T collar for 24 hours   Medications: Reviewed on Rounds  Physical Exam:  Vitals: Temperature 98.1 pulse 109 respiratory 19 blood pressure is 134/71 saturations 98%  Ventilator Settings on assist control FiO2 28% tidal line 357 PEEP 5  General: Comfortable at this time Neck: supple Cardiovascular: no malignant arrhythmias Respiratory: No rhonchi no rales are noted at this time Skin: no rash seen on limited exam Musculoskeletal: No gross abnormality Psychiatric:unable to assess Neurologic:no involuntary movements         Lab Data:   Basic Metabolic Panel: Recent Labs  Lab 11/02/21 0614 11/05/21 0334 11/05/21 0910  NA 137 142  --   K 4.8 5.3* 5.0  CL 100 100  --   CO2 33* 36*  --   GLUCOSE 125* 129*  --   BUN 28* 32*  --   CREATININE 0.46 0.52  --   CALCIUM 8.8* 9.2  --   MG 1.8 2.0  --     ABG: No results for input(s): PHART, PCO2ART, PO2ART, HCO3, O2SAT in the last 168 hours.  Liver Function Tests: No results for input(s): AST, ALT, ALKPHOS, BILITOT, PROT, ALBUMIN in the last 168 hours. No results for input(s): LIPASE, AMYLASE in the last 168 hours. No results for input(s): AMMONIA in the last 168 hours.  CBC: Recent Labs  Lab 11/02/21 0614 11/05/21 0334  WBC 5.4 4.9  HGB 8.9* 10.3*  HCT 29.8* 34.1*  MCV 99.0 100.6*  PLT 194 172    Cardiac Enzymes: No results for input(s): CKTOTAL, CKMB, CKMBINDEX, TROPONINI in the last 168 hours.  BNP (last 3 results) No results for input(s): BNP in the last  8760 hours.  ProBNP (last 3 results) No results for input(s): PROBNP in the last 8760 hours.  Radiological Exams: No results found.  Assessment/Plan Active Problems:   Acute on chronic respiratory failure with hypoxia (HCC)   Abscess in epidural space of cervical spine   Tracheostomy status (HCC)   COPD, severe (HCC)   Bilateral pleural effusion   Acute on chronic respiratory failure hypoxia patient is going to remain on T-piece starting today for 24 hours Epidural abscess treated slowly improving we will continue to follow along closely. Tracheostomy remains in place Severe COPD medical management we will continue to follow along Bilateral pleural effusions status post drainage   I have personally seen and evaluated the patient, evaluated laboratory and imaging results, formulated the assessment and plan and placed orders. The Patient requires high complexity decision making with multiple systems involvement.  Rounds were done with the Respiratory Therapy Director and Staff therapists and discussed with nursing staff also.  Yevonne Pax, MD [facility] Pulmonary Critical Care Medicine Sleep Medicine

## 2021-11-06 LAB — BLOOD GAS, ARTERIAL
Acid-Base Excess: 11.9 mmol/L — ABNORMAL HIGH (ref 0.0–2.0)
Acid-Base Excess: 13.3 mmol/L — ABNORMAL HIGH (ref 0.0–2.0)
Bicarbonate: 41.1 mmol/L — ABNORMAL HIGH (ref 20.0–28.0)
Bicarbonate: 39.1 mmol/L — ABNORMAL HIGH (ref 20.0–28.0)
FIO2: 100
FIO2: 28
O2 Saturation: 97.8 %
O2 Saturation: 97.3 %
Patient temperature: 36
Patient temperature: 36.1
pCO2 arterial: >120 mmHg (ref 32.0–48.0)
pCO2 arterial: 66.7 mmHg (ref 32.0–48.0)
pH, Arterial: 7.145 — CL (ref 7.350–7.450)
pH, Arterial: 7.38 (ref 7.350–7.450)
pO2, Arterial: 114 mmHg — ABNORMAL HIGH (ref 83.0–108.0)
pO2, Arterial: 76.7 mmHg — ABNORMAL LOW (ref 83.0–108.0)

## 2021-11-07 ENCOUNTER — Other Ambulatory Visit (HOSPITAL_COMMUNITY): Payer: Medicare Other

## 2021-11-07 DIAGNOSIS — G061 Intraspinal abscess and granuloma: Secondary | ICD-10-CM | POA: Diagnosis not present

## 2021-11-07 DIAGNOSIS — J9621 Acute and chronic respiratory failure with hypoxia: Secondary | ICD-10-CM | POA: Diagnosis not present

## 2021-11-07 DIAGNOSIS — J449 Chronic obstructive pulmonary disease, unspecified: Secondary | ICD-10-CM | POA: Diagnosis not present

## 2021-11-07 DIAGNOSIS — J90 Pleural effusion, not elsewhere classified: Secondary | ICD-10-CM | POA: Diagnosis not present

## 2021-11-07 LAB — BLOOD GAS, ARTERIAL
Acid-Base Excess: 12.7 mmol/L — ABNORMAL HIGH (ref 0.0–2.0)
Bicarbonate: 38.4 mmol/L — ABNORMAL HIGH (ref 20.0–28.0)
FIO2: 28
O2 Saturation: 98.7 %
Patient temperature: 37
pCO2 arterial: 68.4 mmHg (ref 32.0–48.0)
pH, Arterial: 7.369 (ref 7.350–7.450)
pO2, Arterial: 112 mmHg — ABNORMAL HIGH (ref 83.0–108.0)

## 2021-11-07 LAB — ECHOCARDIOGRAM COMPLETE
AR max vel: 1.64 cm2
AV Area VTI: 1.18 cm2
AV Area mean vel: 1.38 cm2
AV Mean grad: 4 mmHg
AV Peak grad: 6.5 mmHg
Ao pk vel: 1.27 m/s
Area-P 1/2: 4.21 cm2
Calc EF: 63.9 %
S' Lateral: 2 cm
Single Plane A2C EF: 75.7 %
Single Plane A4C EF: 47.3 %

## 2021-11-07 LAB — BASIC METABOLIC PANEL
Anion gap: 5 (ref 5–15)
BUN: 55 mg/dL — ABNORMAL HIGH (ref 8–23)
CO2: 40 mmol/L — ABNORMAL HIGH (ref 22–32)
Calcium: 9.8 mg/dL (ref 8.9–10.3)
Chloride: 101 mmol/L (ref 98–111)
Creatinine, Ser: 0.79 mg/dL (ref 0.44–1.00)
GFR, Estimated: >60 mL/min (ref >60)
Glucose, Bld: 114 mg/dL — ABNORMAL HIGH (ref 70–99)
Potassium: 5.2 mmol/L — ABNORMAL HIGH (ref 3.5–5.1)
Sodium: 146 mmol/L — ABNORMAL HIGH (ref 135–145)

## 2021-11-07 LAB — CBC
HCT: 30.9 % — ABNORMAL LOW (ref 36.0–46.0)
Hemoglobin: 9.2 g/dL — ABNORMAL LOW (ref 12.0–15.0)
MCH: 29.7 pg (ref 26.0–34.0)
MCHC: 29.8 g/dL — ABNORMAL LOW (ref 30.0–36.0)
MCV: 99.7 fL (ref 80.0–100.0)
Platelets: 157 10*3/uL (ref 150–400)
RBC: 3.1 MIL/uL — ABNORMAL LOW (ref 3.87–5.11)
RDW: 16.2 % — ABNORMAL HIGH (ref 11.5–15.5)
WBC: 6.5 10*3/uL (ref 4.0–10.5)
nRBC: 0 % (ref 0.0–0.2)

## 2021-11-07 LAB — MAGNESIUM: Magnesium: 2.1 mg/dL (ref 1.7–2.4)

## 2021-11-07 IMAGING — DX DG CHEST 1V PORT
1 series · 1 of 1 positions shown · non-contrast
Comparison: Ten days ago

CLINICAL DATA: Respiratory failure

EXAM:
PORTABLE CHEST 1 VIEW

[chest ap]
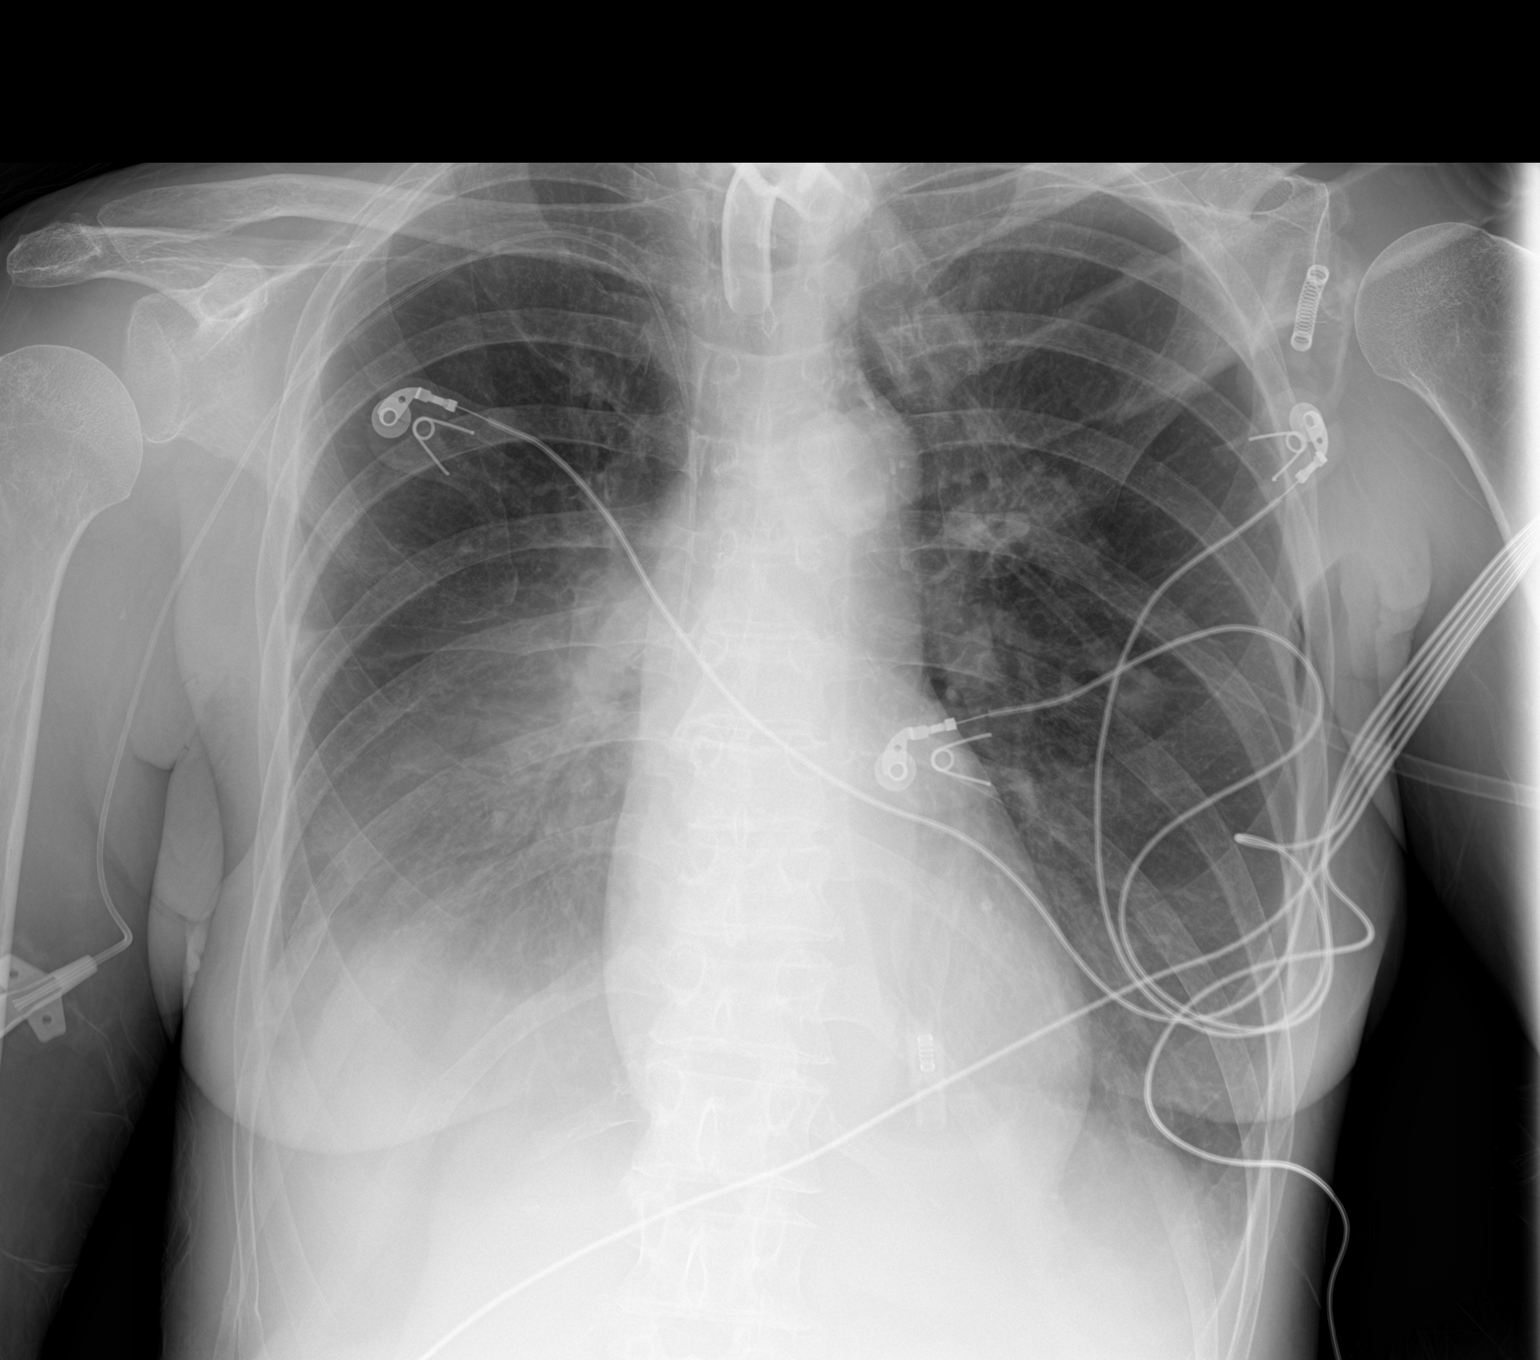

[1 of 1 positions shown; findings below may reference images not displayed]

FINDINGS: Continued hazy appearance of the right more than left chest
including pleural fluid on the right. Normal heart size and
mediastinal contours. Right PICC with tip at the SVC. Artifact from
EKG leads. Tracheostomy tube with tip just below the clavicular
heads. Cervical spine fusion.
IMPRESSION: Unchanged hazy appearance of the right more than left chest usually
from atelectasis and pleural fluid.

## 2021-11-07 IMAGING — CT CT CHEST W/O CM
2 of 4 series · 15 of 36 positions shown, 18 images · non-contrast
Comparison: Chest x-ray [DATE].

CLINICAL DATA: Pleural effusion.



[Series 3: chest (person_name) · axial · 0.54mm/px · z∈[+680,+904]mm · 12 of 134 slices shown, 15 images]
[im 11/134  mediastinal]
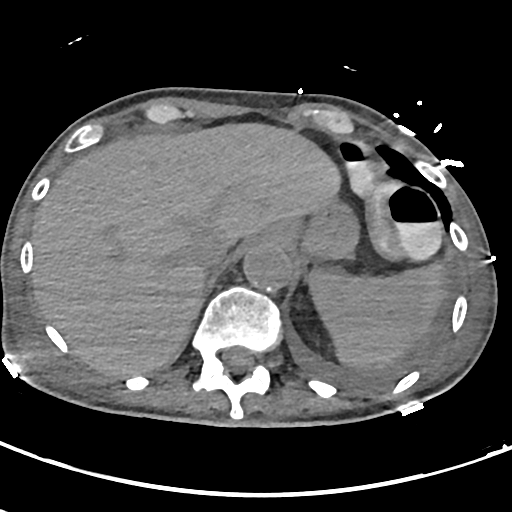
[im 11/134  lung]
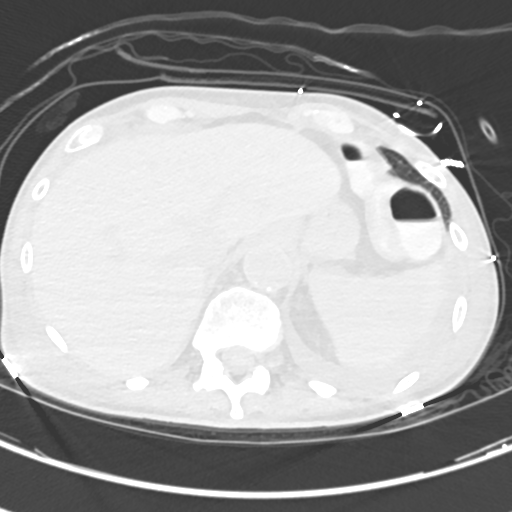
[im 21/134  lung]
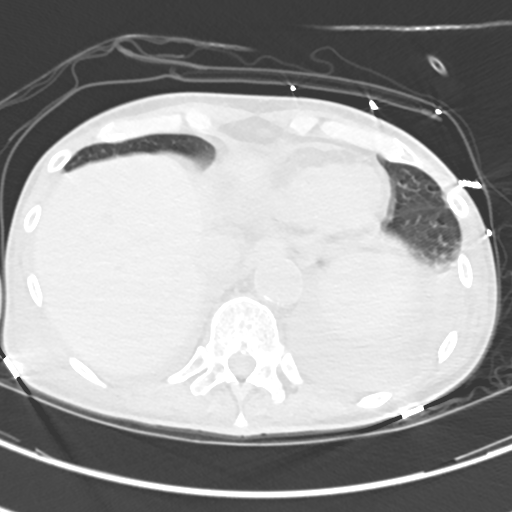
[im 31/134  lung]
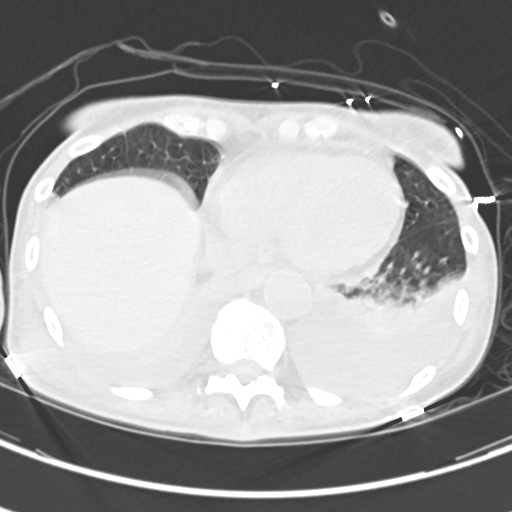
[im 41/134  lung]
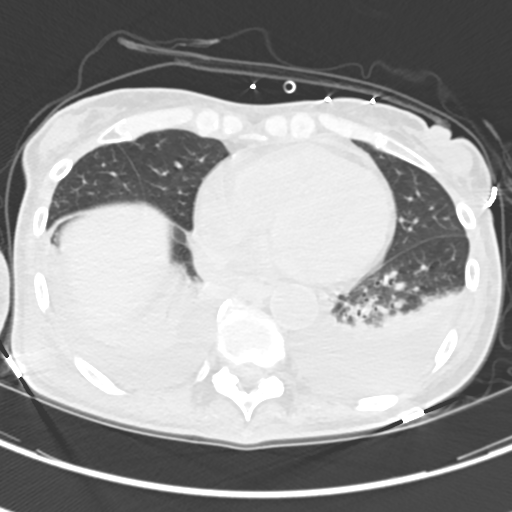
[im 52/134  mediastinal]
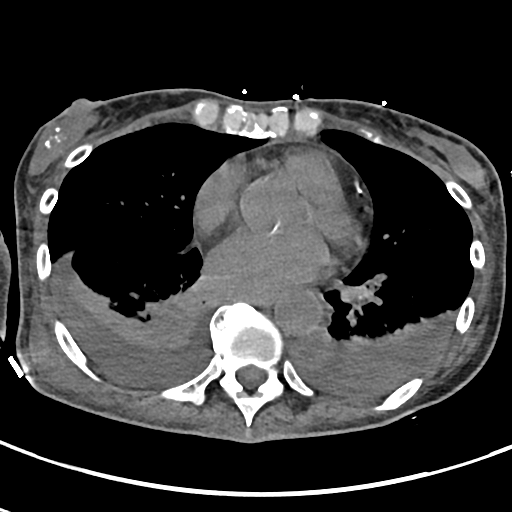
[im 52/134  lung]
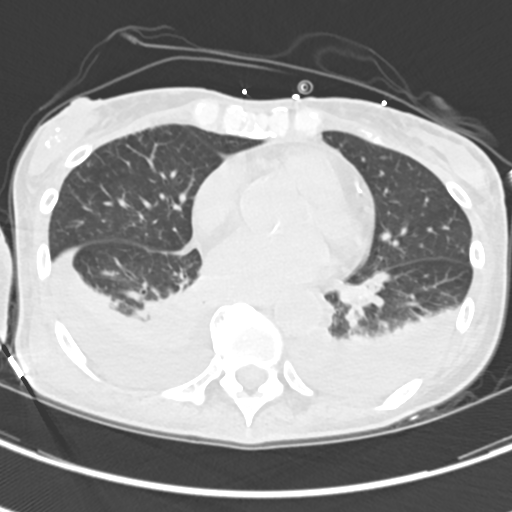
[im 62/134  lung]
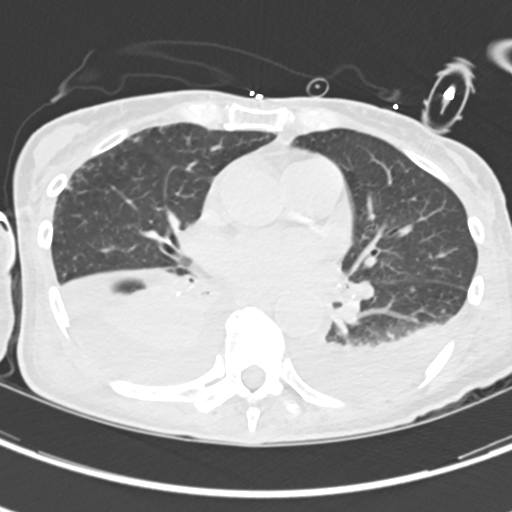
[im 72/134  lung]
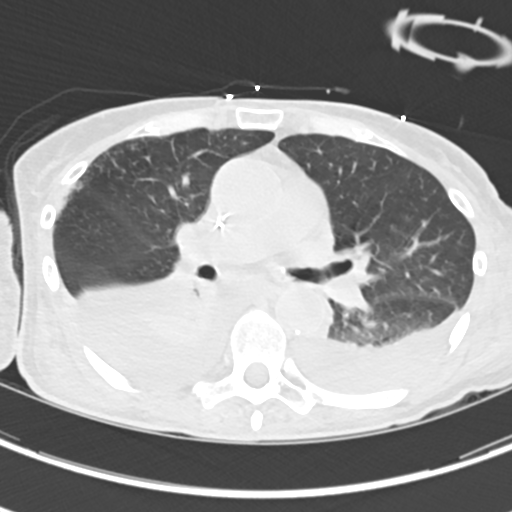
[im 82/134  lung]
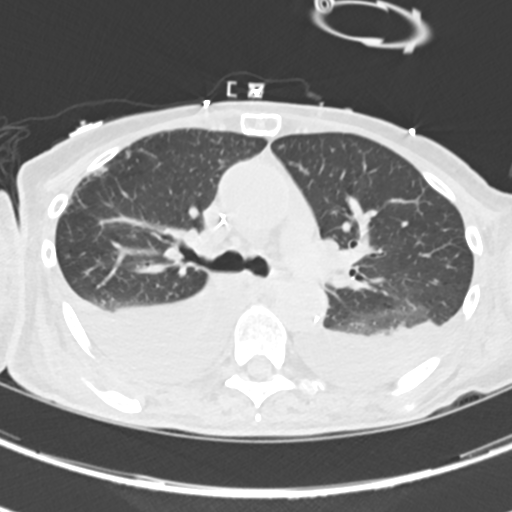
[im 93/134  mediastinal]
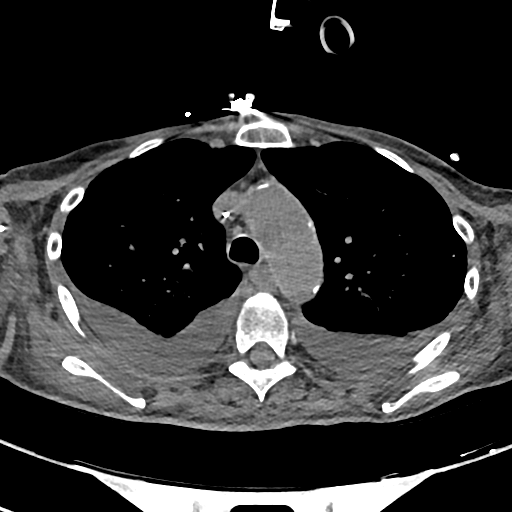
[im 93/134  lung]
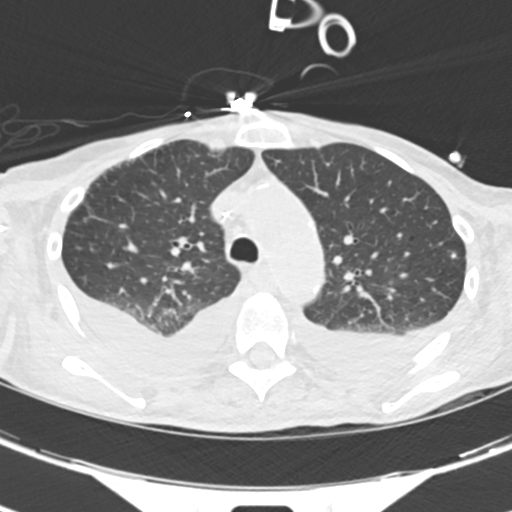
[im 103/134  lung]
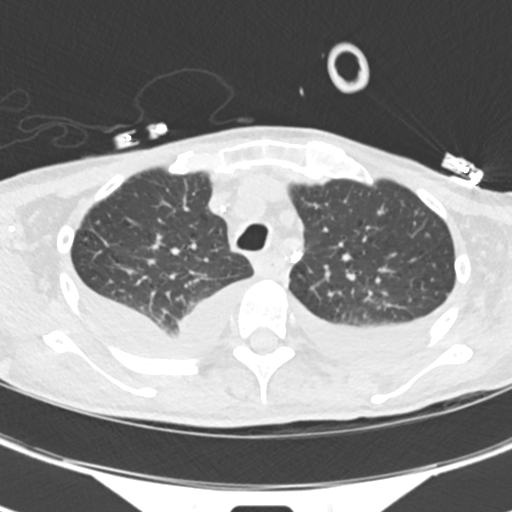
[im 113/134  lung]
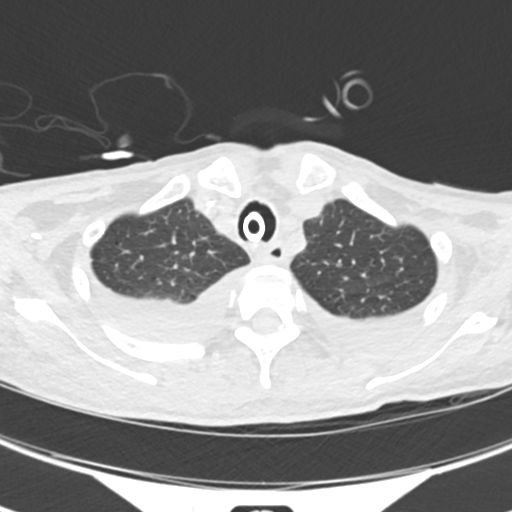
[im 123/134  lung]
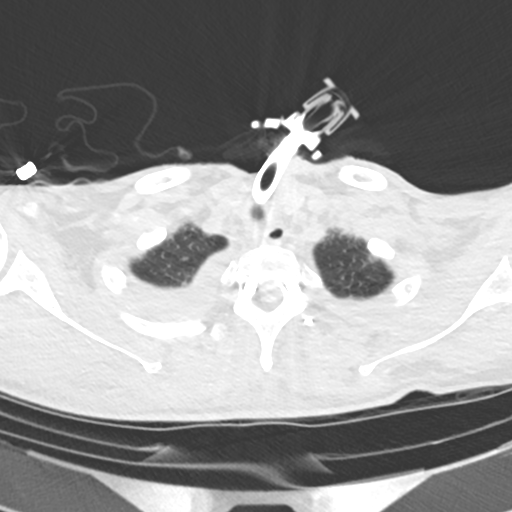

[Series 6: (person_name) · coronal · 0.54mm/px · 3 of 103 slices shown]
[im 21/103  lung]
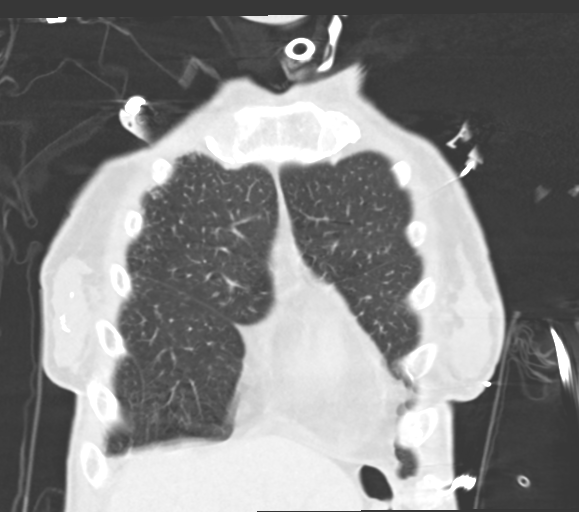
[im 41/103  lung]
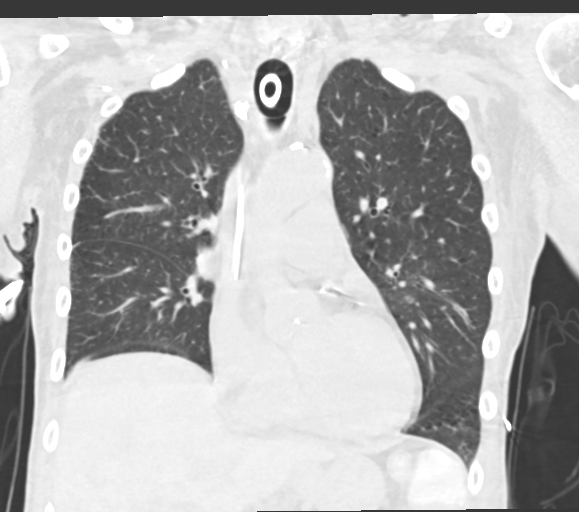
[im 62/103  lung]
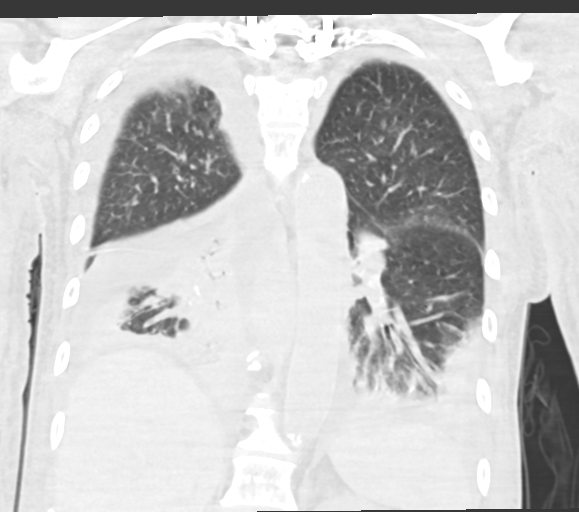

[15 of 36 positions shown; findings below may reference images not displayed]

FINDINGS: Cardiovascular: Heart and aorta are normal in size. There is no
pericardial effusion. There are atherosclerotic calcifications of
the aorta and coronary arteries. Right-sided central venous catheter
tip ends in the distal SVC.

Mediastinum/Nodes: No enlarged mediastinal or axillary lymph nodes.
Thyroid gland, trachea, and esophagus demonstrate no significant
findings.

Lungs/Pleura: There are small to moderate-sized bilateral pleural
effusions. There is compressive atelectasis within the bilateral
lower lobes. There is additional consolidation with air bronchograms
in the right lower lobe. There are few peripheral reticular
opacities in the right upper lobe.

The tip of the tracheostomy is at the level of the clavicular heads.
There secretions in the left mainstem bronchus. Secretions are also
seen within the bilateral lower lobe bronchi with some occlusions in
right lower lobe bronchi. There is no evidence for pneumothorax.
There is a calcified granuloma in the left upper lobe.

Upper Abdomen: Left adrenal nodule measures 1.7 x 2.3 cm and is
incompletely imaged. Limited evaluation of the upper abdomen is
otherwise within normal limits.

Musculoskeletal: No acute fractures are seen. T1-T2 posterior fusion
hardware is visualized.
IMPRESSION: 1. Small to moderate size bilateral pleural effusions.
2. Bilateral lower lobe atelectasis with airspace consolidation
throughout the right lower lobe worrisome for pneumonia/aspiration.
3. There is plugging of bilateral lower lobe bronchi, right greater
than left. There also secretions in the left mainstem bronchus.
4. Indeterminate left adrenal nodule. Recommend further evaluation
with adrenal CT or MRI.

Aortic Atherosclerosis ([ZQ]-[ZQ]).

## 2021-11-07 IMAGING — CT CT HEAD W/O CM
4 series · 16 of 47 positions shown, 18 images · non-contrast
Comparison: None.

CLINICAL DATA: Seizure, new-onset, no history of trauma evaluate
for acute intracranial process



[Series 3: head (person_name) · axial · 0.39mm/px · z∈[+1036,+1156]mm · 7 of 32 slices shown, 9 images]
[im 4/32  brain]
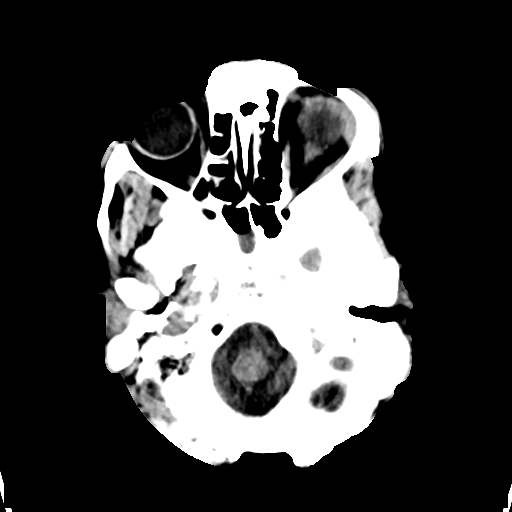
[im 4/32  bone]
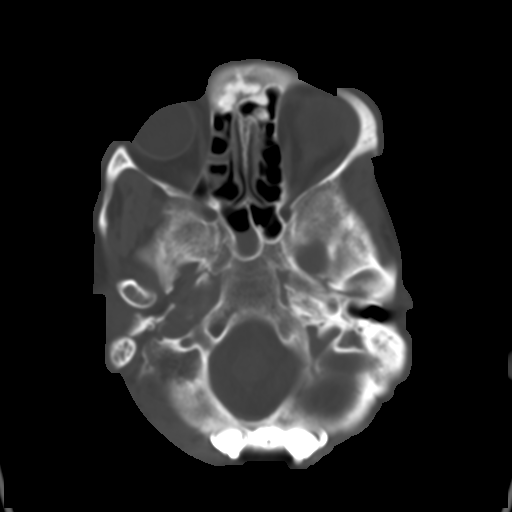
[im 8/32  brain]
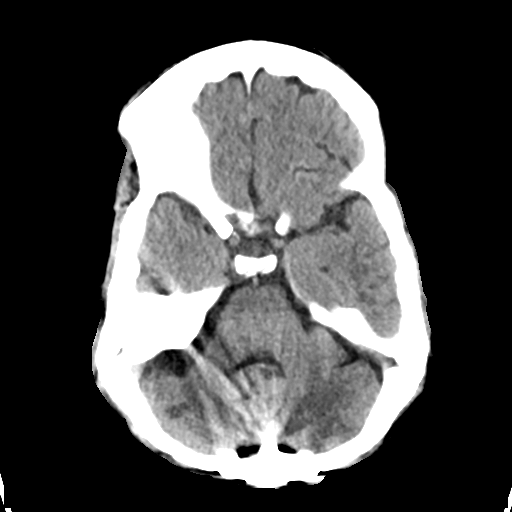
[im 12/32  brain]
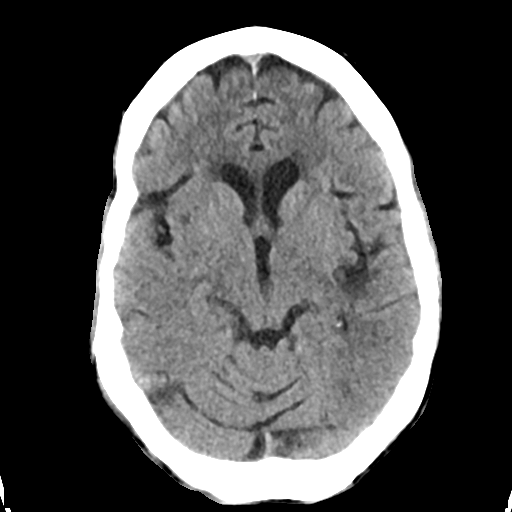
[im 16/32  brain]
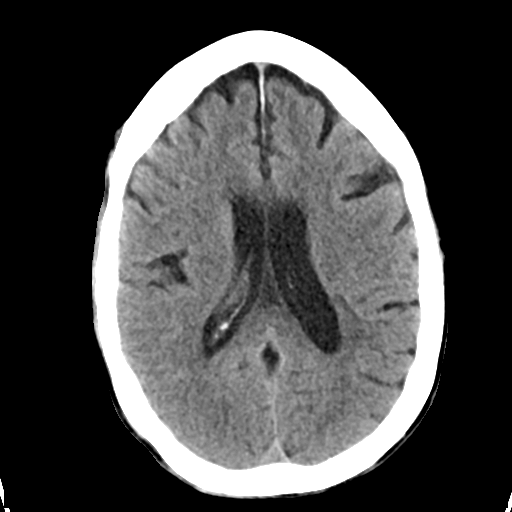
[im 20/32  brain]
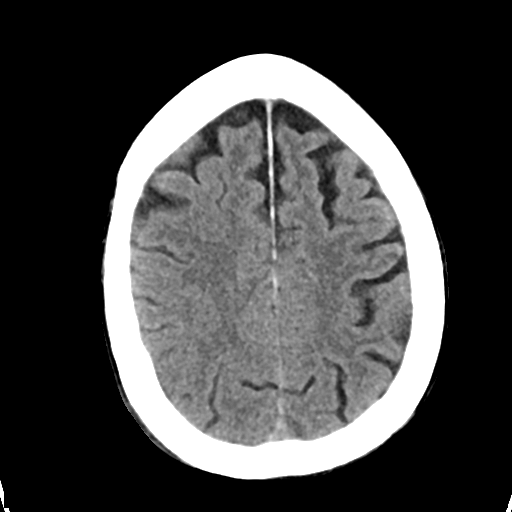
[im 20/32  bone]
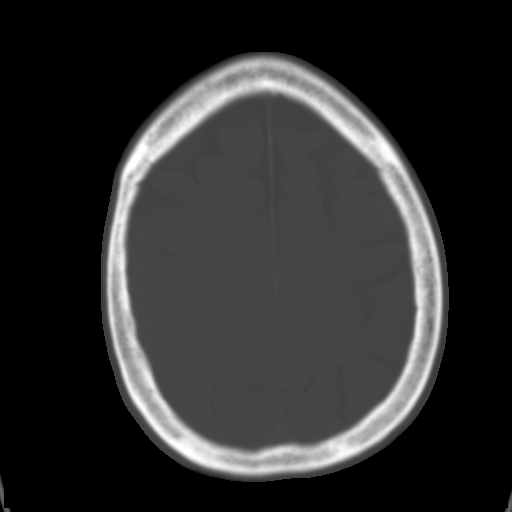
[im 24/32  brain]
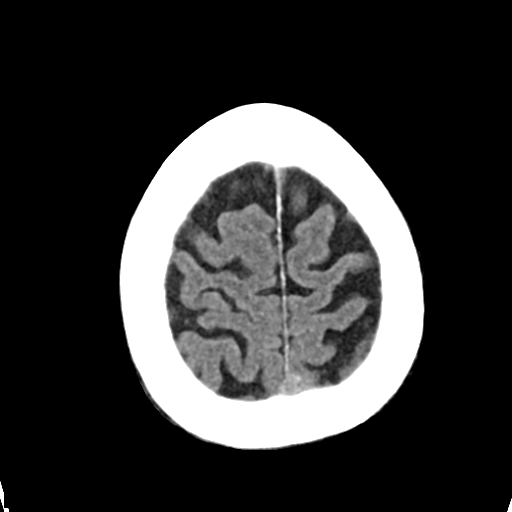
[im 28/32  brain]
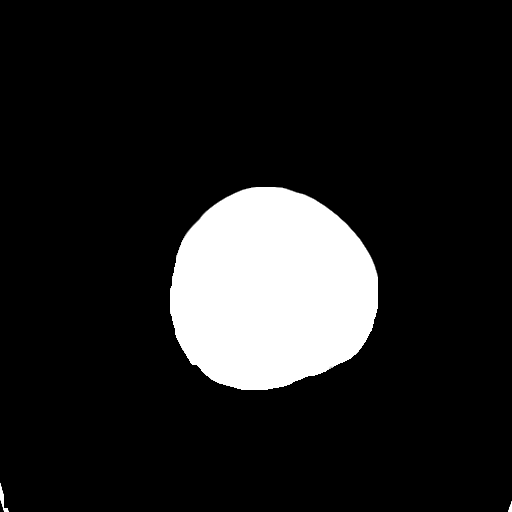

[Series 4: head bone (person_name) · axial · 0.39mm/px · z∈[+1034,+1066]mm · 3 of 79 slices shown]
[im 8/79  bone]
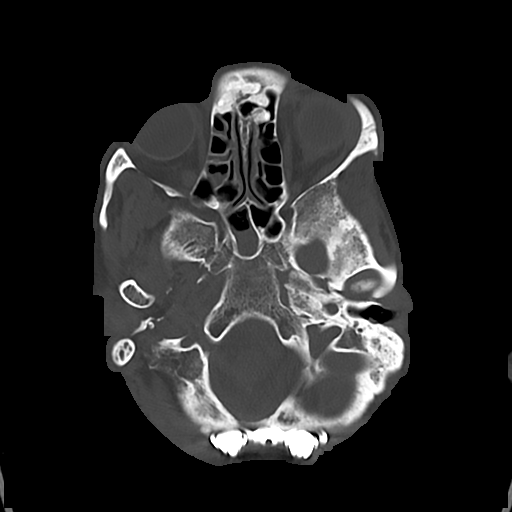
[im 16/79  bone]
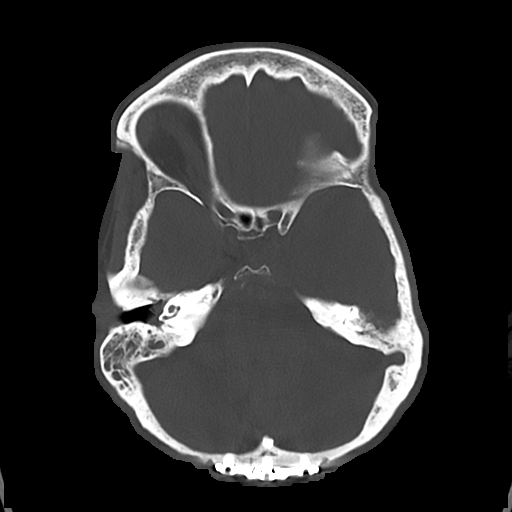
[im 24/79  bone]
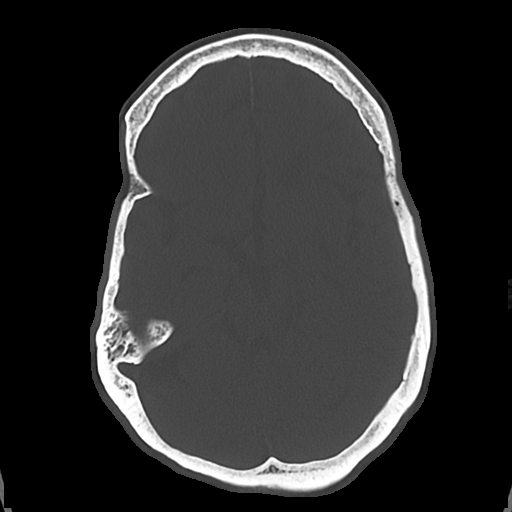

[Series 5: cor soft (person_name) · coronal · 0.31mm/px · 3 of 66 slices shown]
[im 22/66  brain]
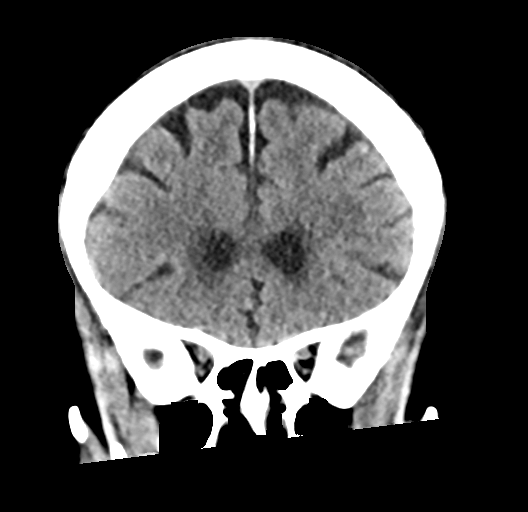
[im 29/66  brain]
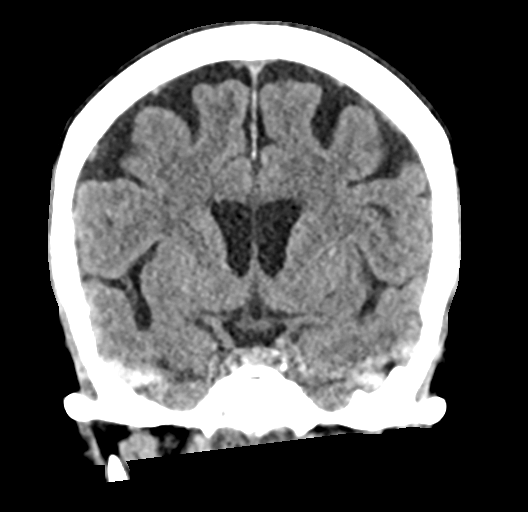
[im 37/66  brain]
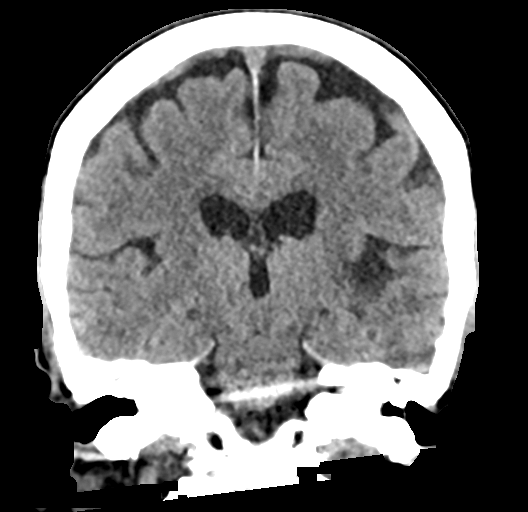

[Series 6: sag soft (person_name) · sagittal · 0.31mm/px · 3 of 54 slices shown]
[im 18/54  brain]
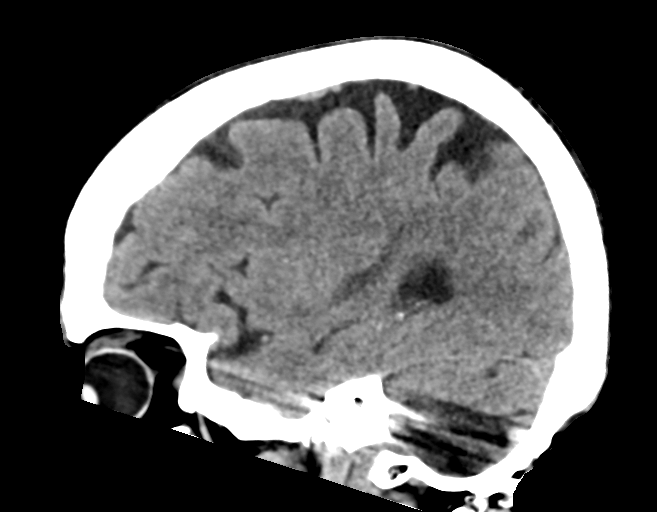
[im 27/54  brain]
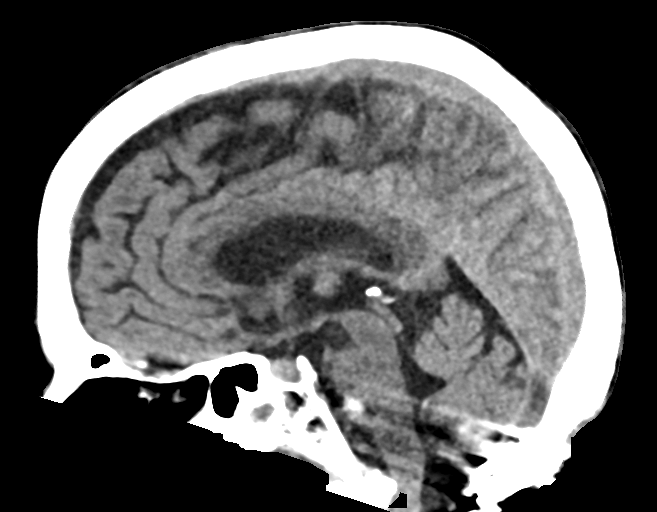
[im 36/54  brain]
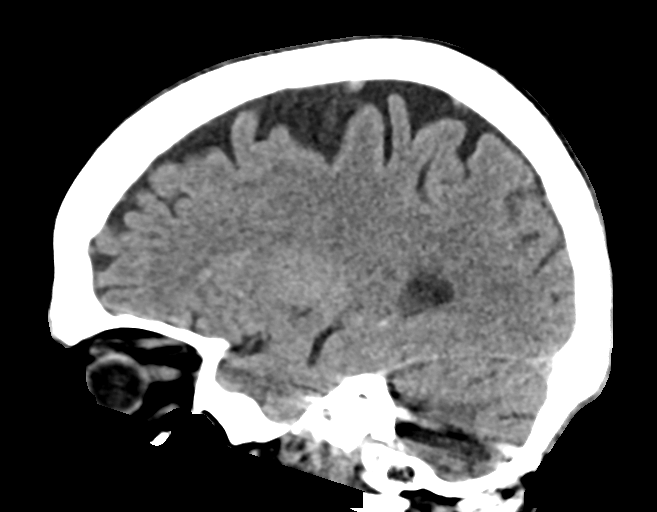

[16 of 47 positions shown; findings below may reference images not displayed]

BRAIN:
BRAIN
Patchy and confluent areas of decreased attenuation are noted
throughout the deep and periventricular white matter of the cerebral
hemispheres bilaterally, compatible with chronic microvascular
ischemic disease. Limited evaluation of the cerebellum due to streak
artifact.

No evidence of large-territorial acute infarction. No parenchymal
hemorrhage. No mass lesion. No extra-axial collection.

No mass effect or midline shift. No hydrocephalus. Basilar cisterns
are patent.

Vascular: No hyperdense vessel.

Skull: No acute fracture or focal lesion. Cervical and occipital
skull surgical hardware.

Sinuses/Orbits: Paranasal sinuses and mastoid air cells are clear.
The orbits are unremarkable.

Other: None.
IMPRESSION: No acute intracranial abnormality.

## 2021-11-07 NOTE — Progress Notes (Signed)
° °  Echocardiogram 2D Echocardiogram has been performed.  Festus Barren 11/07/2021, 2:38 PM

## 2021-11-07 NOTE — Progress Notes (Signed)
Pulmonary Critical Care Medicine [facility] [place]   PULMONARY CRITICAL CARE SERVICE  PROGRESS NOTE     Lori Kemp  [id number]  DOB: 31-Mar-1956   DOA: 10/28/2021  Referring Physician: Luna Kitchens, MD  HPI: Lori Kemp is a 66 y.o. female being followed for ventilator/airway/oxygen weaning Acute on Chronic Respiratory Failure.  Patient had some change in mental status with decreased mentation had several ABGs done overnight which had shown severe acidosis with a pH of 7.145 the most recent ABG which was this morning showed pH 7.36 PCO2 of 68 and PO2 of 112 this is on assist control mode and on 28% FiO2  Medications: Reviewed on Rounds  Physical Exam:  Vitals: Temperature 98.9 pulse 117 respiratory 21 blood pressure is 161/87 saturations 97%  Ventilator Settings patient is back on the ventilator assist-control FiO2 28% tidal volume 394 PEEP 5  General: Comfortable at this time Neck: supple Cardiovascular: no malignant arrhythmias Respiratory: No rhonchi coarse right breath sounds Skin: no rash seen on limited exam Musculoskeletal: No gross abnormality Psychiatric:unable to assess Neurologic:no involuntary movements         Lab Data:   Basic Metabolic Panel: Recent Labs  Lab 11/02/21 0614 11/05/21 0334 11/05/21 0910 11/07/21 0652  NA 137 142  --  146*  K 4.8 5.3* 5.0 5.2*  CL 100 100  --  101  CO2 33* 36*  --  40*  GLUCOSE 125* 129*  --  114*  BUN 28* 32*  --  55*  CREATININE 0.46 0.52  --  0.79  CALCIUM 8.8* 9.2  --  9.8  MG 1.8 2.0  --  2.1    ABG: Recent Labs  Lab 11/06/21 0925 11/06/21 1814 11/07/21 0518  PHART 7.145* 7.380 7.369  PCO2ART >120.0* 66.7* 68.4*  PO2ART 114* 76.7* 112*  HCO3 41.1* 39.1* 38.4*  O2SAT 97.8 97.3 98.7    Liver Function Tests: No results for input(s): AST, ALT, ALKPHOS, BILITOT, PROT, ALBUMIN in the last 168 hours. No results for input(s): LIPASE, AMYLASE in the last 168 hours. No results for  input(s): AMMONIA in the last 168 hours.  CBC: Recent Labs  Lab 11/02/21 0614 11/05/21 0334 11/07/21 0652  WBC 5.4 4.9 6.5  HGB 8.9* 10.3* 9.2*  HCT 29.8* 34.1* 30.9*  MCV 99.0 100.6* 99.7  PLT 194 172 157    Cardiac Enzymes: No results for input(s): CKTOTAL, CKMB, CKMBINDEX, TROPONINI in the last 168 hours.  BNP (last 3 results) No results for input(s): BNP in the last 8760 hours.  ProBNP (last 3 results) No results for input(s): PROBNP in the last 8760 hours.  Radiological Exams: DG CHEST PORT 1 VIEW  Result Date: 11/07/2021 CLINICAL DATA:  Respiratory failure EXAM: PORTABLE CHEST 1 VIEW COMPARISON:  Ten days ago FINDINGS: Continued hazy appearance of the right more than left chest including pleural fluid on the right. Normal heart size and mediastinal contours. Right PICC with tip at the SVC. Artifact from EKG leads. Tracheostomy tube with tip just below the clavicular heads. Cervical spine fusion. IMPRESSION: Unchanged hazy appearance of the right more than left chest usually from atelectasis and pleural fluid. Electronically Signed   By: Tiburcio Pea M.D.   On: 11/07/2021 06:11    Assessment/Plan Active Problems:   Acute on chronic respiratory failure with hypoxia (HCC)   Abscess in epidural space of cervical spine   Tracheostomy status (HCC)   COPD, severe (HCC)   Bilateral pleural effusion   Acute on  chronic respiratory failure hypoxia yesterday patient was supposed to 24 hours over the weekend on T-piece however with the findings of the blood gas was placed back on the ventilator and at this time I would leave her on the ventilator.  We will drop the respiratory rate down from 20 and then do serial gases as deemed necessary Epidural abscess the finding of the elevated CO2 when attempting the 24-hour T collar is of concern and patient may have significant suppression of the respiratory drive will need to continue to monitor.  This may actually indicate that the  patient is going to need nocturnal vent support Severe COPD medical management we will continue to follow Bilateral effusions we will continue with supportive care Tracheostomy will need to remain in place   I have personally seen and evaluated the patient, evaluated laboratory and imaging results, formulated the assessment and plan and placed orders. The Patient requires high complexity decision making with multiple systems involvement.  Rounds were done with the Respiratory Therapy Director and Staff therapists and discussed with nursing staff also.  Yevonne Pax, MD [facility] Pulmonary Critical Care Medicine Sleep Medicine

## 2021-11-08 ENCOUNTER — Other Ambulatory Visit (HOSPITAL_COMMUNITY): Payer: Medicare Other

## 2021-11-08 DIAGNOSIS — J90 Pleural effusion, not elsewhere classified: Secondary | ICD-10-CM | POA: Diagnosis not present

## 2021-11-08 DIAGNOSIS — G061 Intraspinal abscess and granuloma: Secondary | ICD-10-CM | POA: Diagnosis not present

## 2021-11-08 DIAGNOSIS — J9621 Acute and chronic respiratory failure with hypoxia: Secondary | ICD-10-CM | POA: Diagnosis not present

## 2021-11-08 DIAGNOSIS — J449 Chronic obstructive pulmonary disease, unspecified: Secondary | ICD-10-CM | POA: Diagnosis not present

## 2021-11-08 HISTORY — PX: IR THORACENTESIS ASP PLEURAL SPACE W/IMG GUIDE: IMG5380

## 2021-11-08 LAB — PROTEIN, PLEURAL OR PERITONEAL FLUID: Total protein, fluid: <3 g/dL

## 2021-11-08 LAB — GLUCOSE, PLEURAL OR PERITONEAL FLUID: Glucose, Fluid: 150 mg/dL

## 2021-11-08 LAB — POTASSIUM: Potassium: 4.6 mmol/L (ref 3.5–5.1)

## 2021-11-08 LAB — GRAM STAIN

## 2021-11-08 LAB — LACTATE DEHYDROGENASE, PLEURAL OR PERITONEAL FLUID: LD, Fluid: 94 U/L — ABNORMAL HIGH (ref 3–23)

## 2021-11-08 IMAGING — MR MR MRA NECK WO/W CM
8 series · 44 of 48 positions shown · IV contrast (Contrast agent)
Comparison: Report from [DATE] neck CTA from [HOSPITAL]

CLINICAL DATA: Vertebral artery dissection suspected.

EXAM:
MRA NECK WITHOUT AND WITH CONTRAST
TECHNIQUE: Multiplanar and multiecho pulse sequences of the neck were obtained
without and with intravenous contrast. Angiographic images of the
neck were obtained using MRA technique without and with intravenous
contrast.
CONTRAST:  6mL GADAVIST GADOBUTROL 1 MMOL/ML IV SOLN

[Series 7: tof_fl3d_tra_iso · axial · B · 0.6mm · 0.52mm/px · z∈[-51,+28]mm · 8 of 133 slices shown]
[im 1/133]
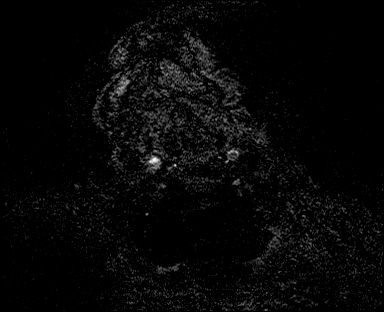
[im 19/133]
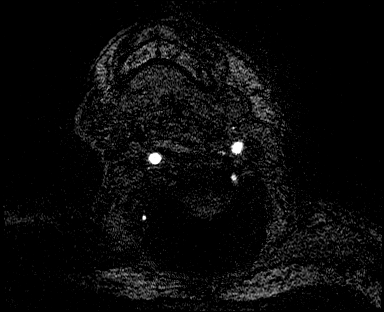
[im 38/133]
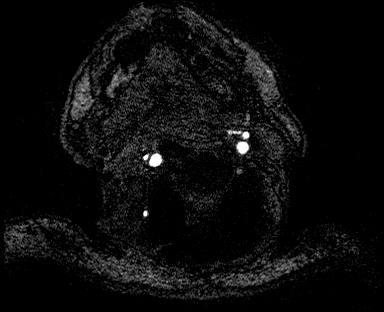
[im 57/133]
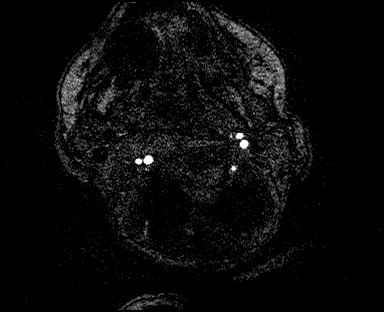
[im 76/133]
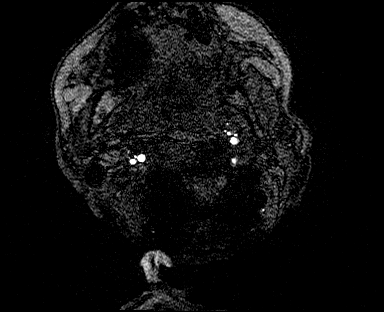
[im 95/133]
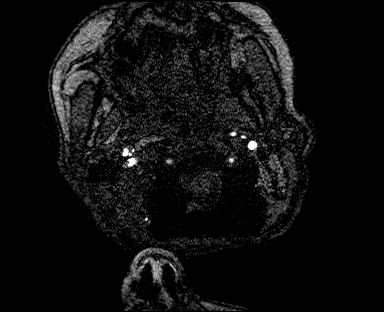
[im 114/133]
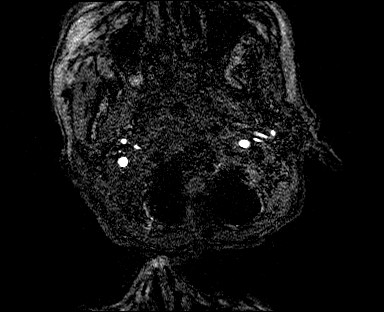
[im 133/133]
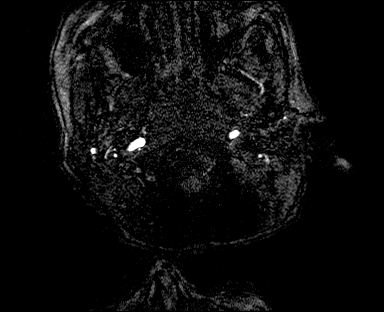

[Series 10: angio_fl3d_cor_pre_ttc=3.0s · coronal · B · 0.9mm · 0.85mm/px · 6 of 80 slices shown]
[im 1/80]
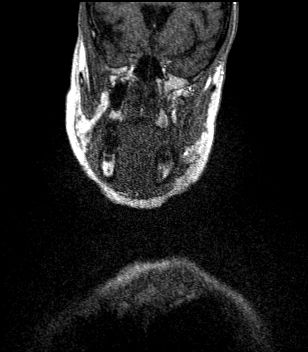
[im 16/80]
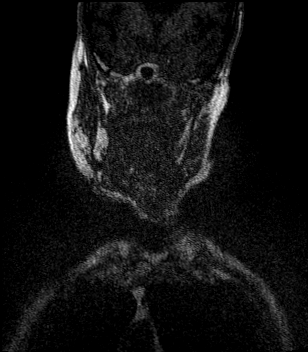
[im 32/80]
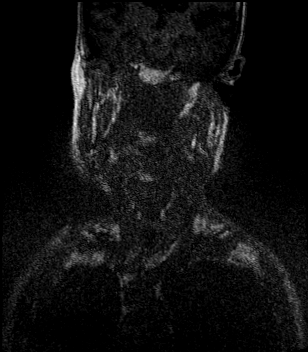
[im 48/80]
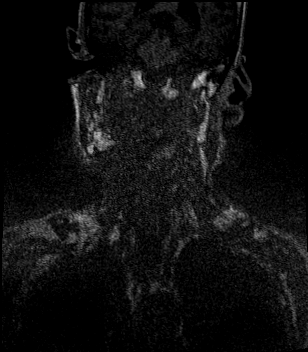
[im 64/80]
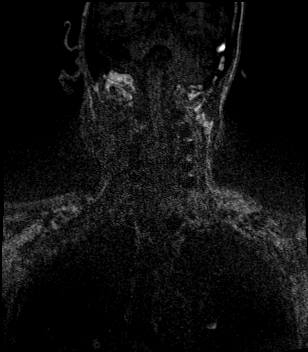
[im 80/80]
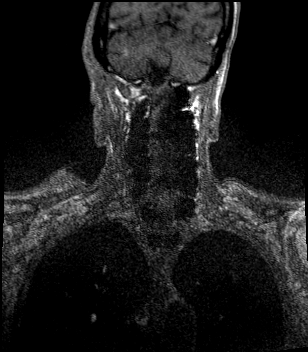

[Series 12: angio_fl3d_cor_post_ttc=3.0s · coronal · B · 0.9mm · 0.85mm/px · 5 of 75 slices shown (1 of 2)]
[im 1/75]
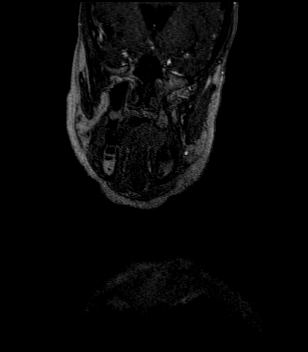
[im 19/75]
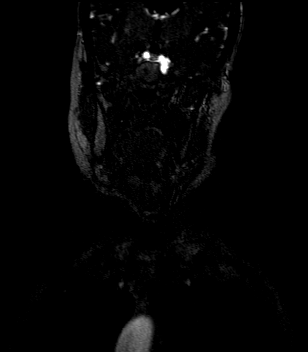
[im 38/75]
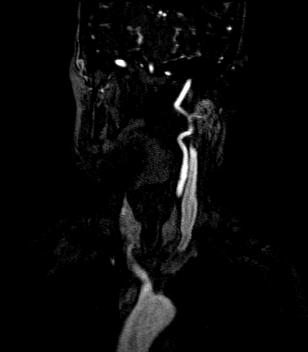
[im 56/75]
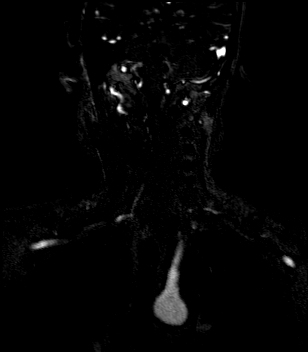
[im 75/75]
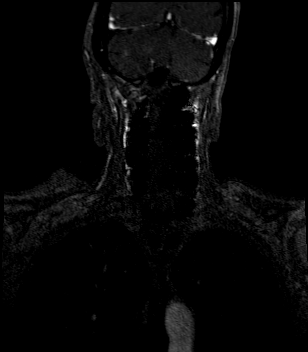

[Series 13: angio_fl3d_cor_post_ttc=3.0s_moco-adv · coronal · B · 0.9mm · 0.85mm/px · 5 of 73 slices shown (1 of 2)]
[im 1/73]
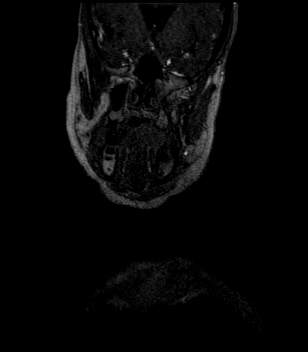
[im 19/73]
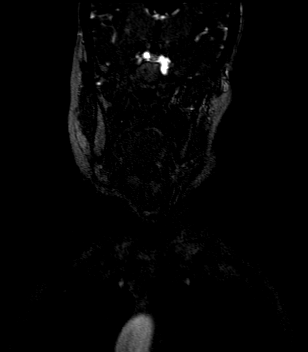
[im 37/73]
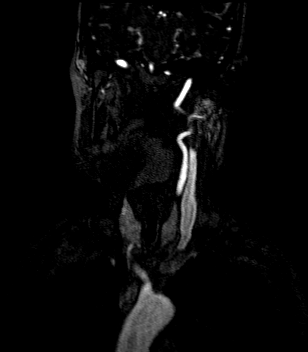
[im 55/73]
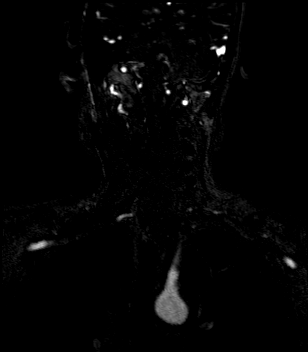
[im 73/73]
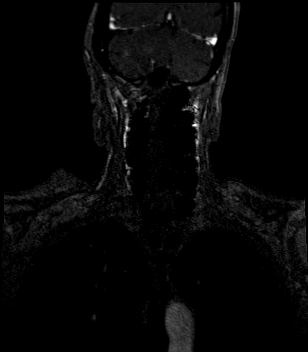

[Series 14: angio_fl3d_cor_post_ttc=3.0s_moco-adv_sub · coronal · B · 0.9mm · 0.85mm/px · 6 of 79 slices shown (1 of 2)]
[im 1/79]
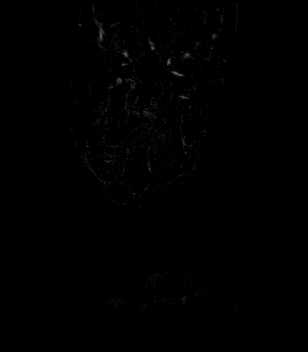
[im 16/79]
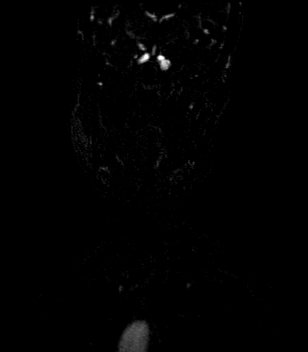
[im 32/79]
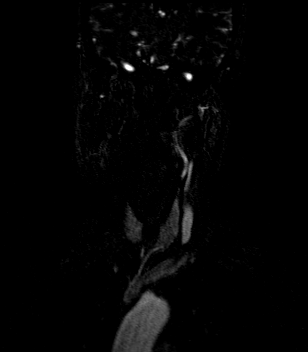
[im 47/79]
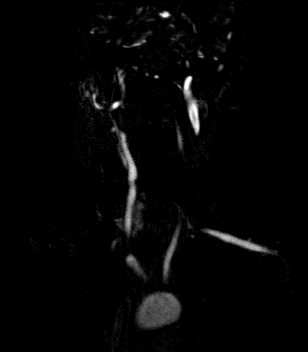
[im 63/79]
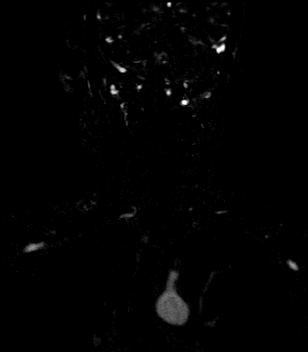
[im 79/79]
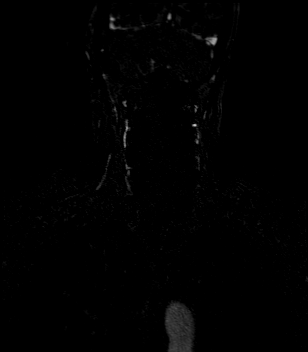

[Series 16: angio_fl3d_cor_post_ttc=3.0s · coronal · B · 0.9mm · 0.85mm/px · 6 of 80 slices shown (2 of 2)]
[im 1/80]
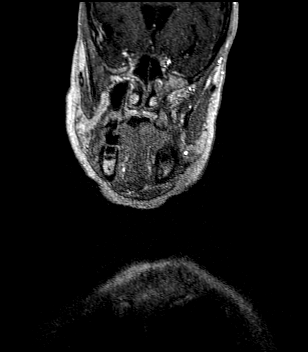
[im 16/80]
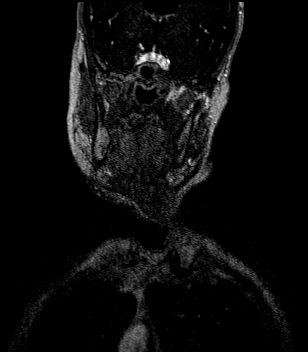
[im 32/80]
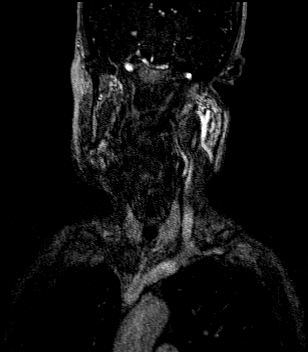
[im 48/80]
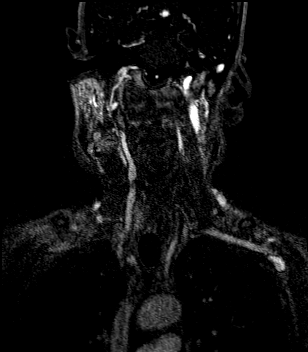
[im 64/80]
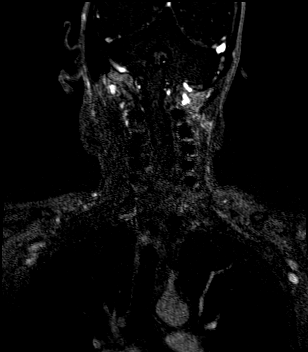
[im 80/80]
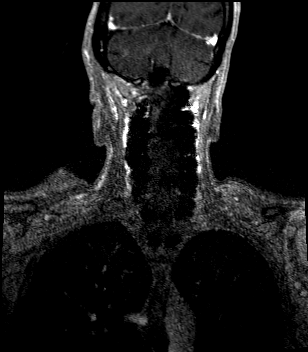

[Series 17: angio_fl3d_cor_post_ttc=3.0s_moco-adv · coronal · B · 0.9mm · 0.85mm/px · 6 of 80 slices shown (2 of 2)]
[im 1/80]
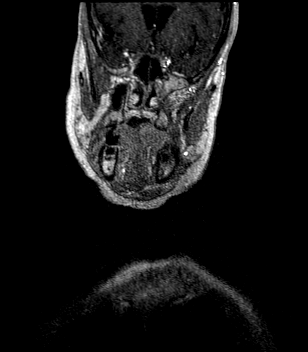
[im 16/80]
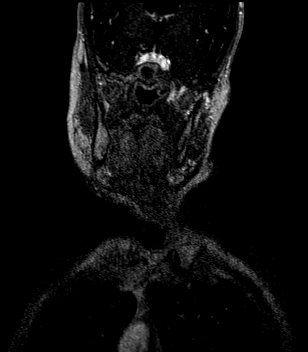
[im 32/80]
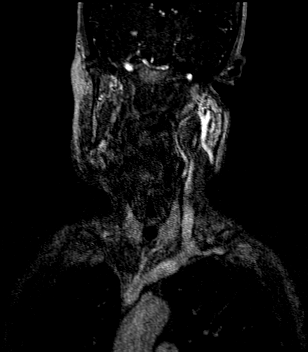
[im 48/80]
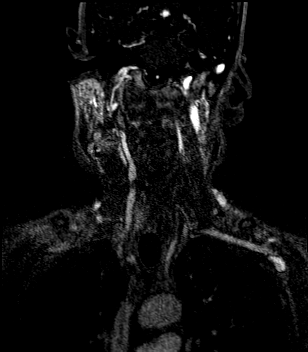
[im 64/80]
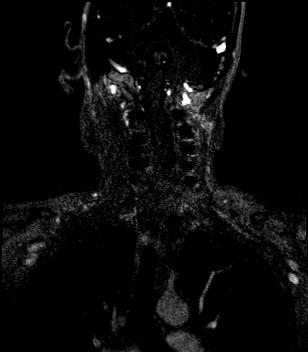
[im 80/80]
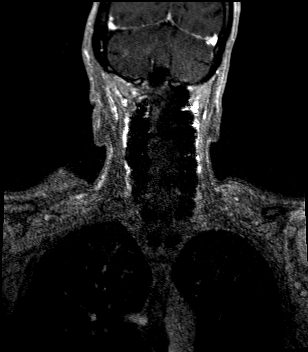

[Series 18: angio_fl3d_cor_post_ttc=3.0s_moco-adv_sub · coronal · B · 0.9mm · 0.85mm/px · 2 of 80 slices shown (2 of 2)]
[im 1/80]
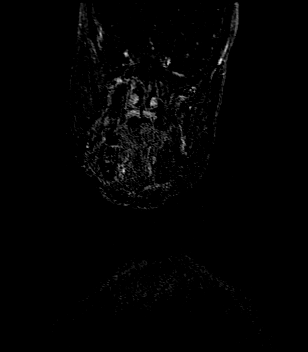
[im 16/80]
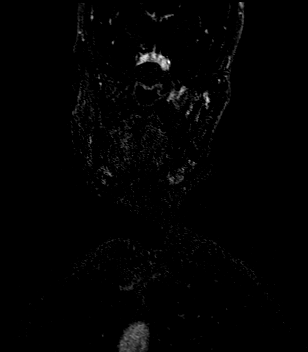

[44 of 48 positions shown; findings below may reference images not displayed]

FINDINGS: There is a standard 3 vessel aortic arch. The brachiocephalic and
subclavian arteries are patent without evidence of a significant
stenosis.

The bilateral common carotid arteries and left cervical internal
carotid artery are patent and smooth without evidence of a
significant stenosis or dissection. The right cervical ICA is patent
with mild irregularity in its midportion without result in
significant stenosis.

There is diminished opacification of the right V1 and proximal V2
segments with evidence of occlusion of the more distal V2 segment
and reconstitution of the V3 and V4 segments which is similar to
what was described on the prior outside neck CTA report.

The left vertebral artery is patent proximally, however there is
occlusion of the distal V2 and proximal V3 segments with
reconstitution of the more distal V3 and V4 segments, also similar
to what was described in the prior outside report. There is
mild-to-moderate stenosis versus motion artifact through the left
vertebral origin.
IMPRESSION: 1. Bilateral vertebral artery occlusions involving the V2 and V3
segments with distal reconstitution.
2. Patent cervical carotid arteries without significant stenosis.

## 2021-11-08 IMAGING — DX DG CHEST 1V PORT
1 series · 1 of 1 positions shown · non-contrast
Comparison: [DATE]

CLINICAL DATA: Right pleural effusion, status post thoracentesis

EXAM:
PORTABLE CHEST 1 VIEW

[chest ap]
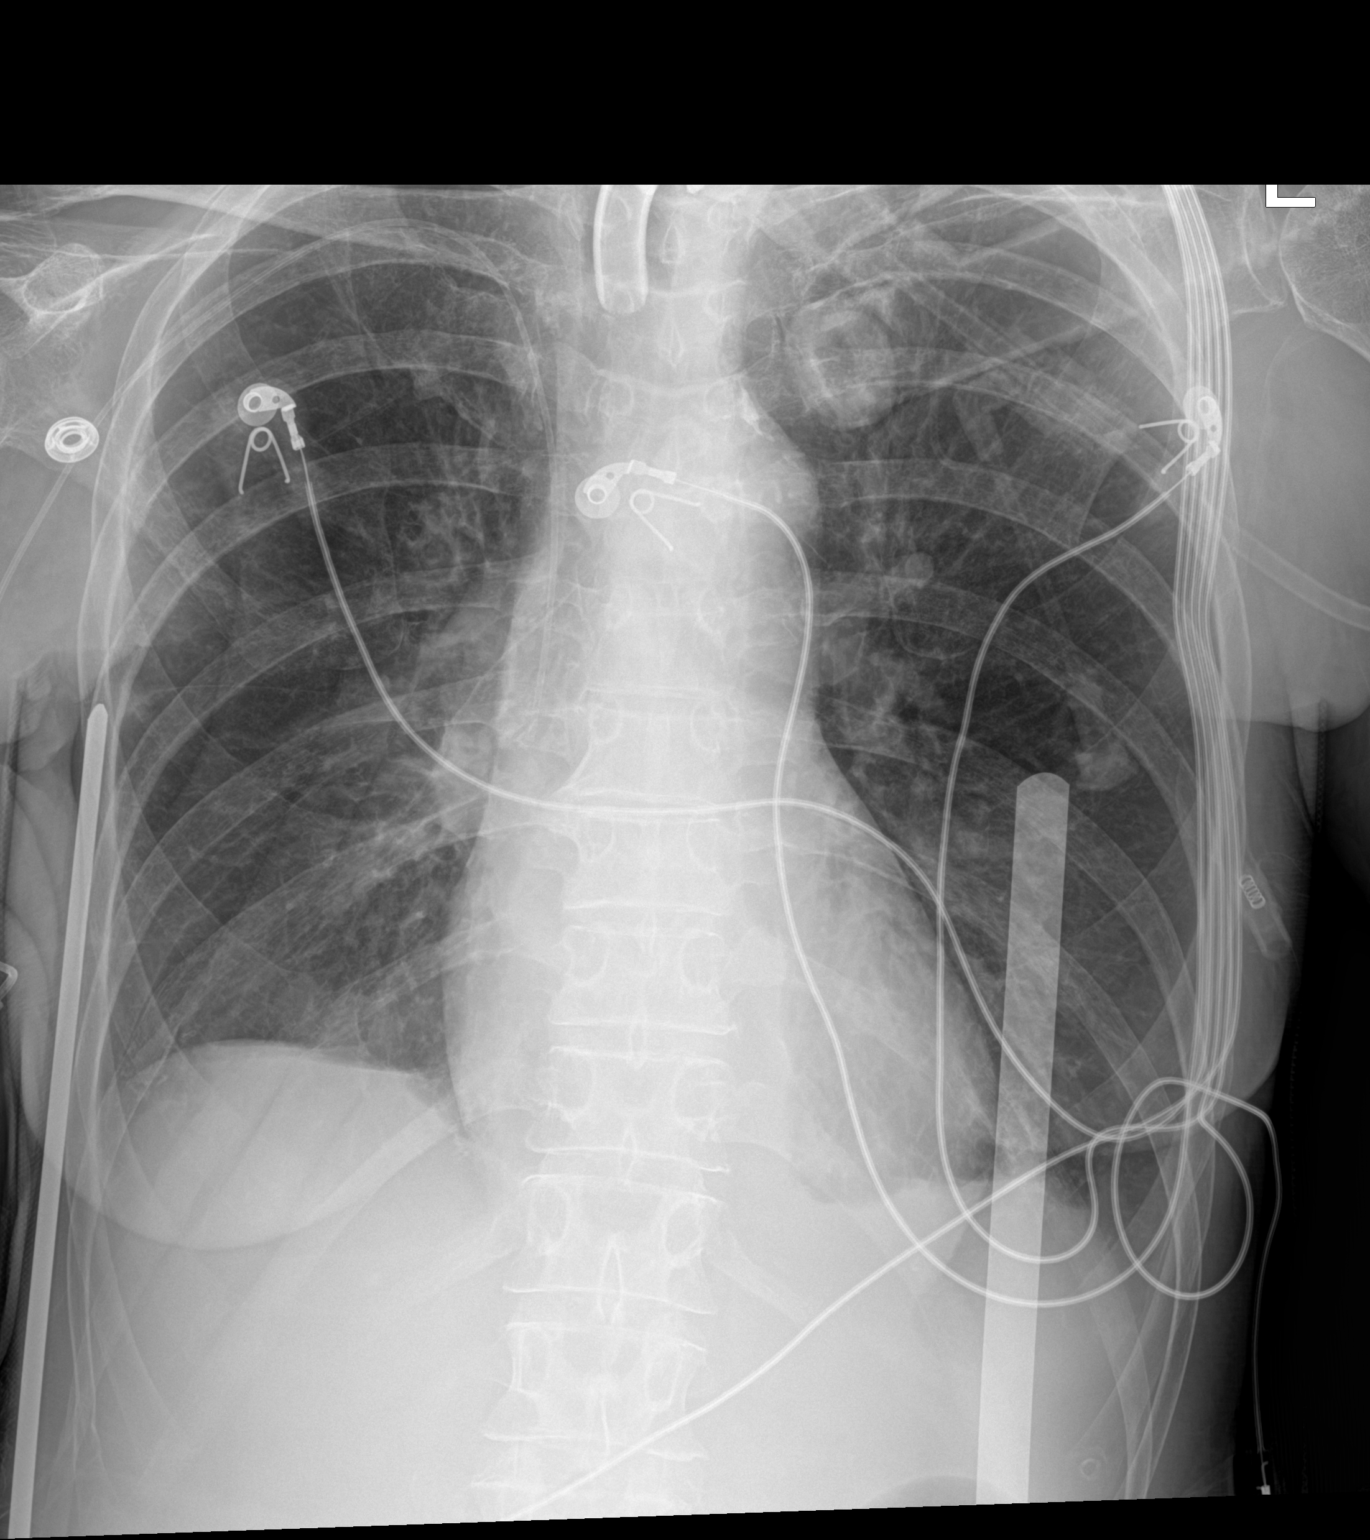

[1 of 1 positions shown; findings below may reference images not displayed]

FINDINGS: Nearly complete resolution of previously seen layering right pleural
effusion following thoracentesis. No significant pneumothorax.
Tracheostomy. Right upper extremity PICC.
IMPRESSION: Nearly complete resolution of previously seen layering right pleural
effusion following thoracentesis. No significant pneumothorax.

## 2021-11-08 MED ORDER — LIDOCAINE HCL 1 % IJ SOLN
INTRAMUSCULAR | Status: AC
  Filled 2021-11-08: qty 20

## 2021-11-08 MED ORDER — GADOBUTROL 1 MMOL/ML IV SOLN
6.0000 mL | Freq: Once | INTRAVENOUS | Status: AC | PRN
  Administered 2021-11-08: 6 mL via INTRAVENOUS

## 2021-11-08 NOTE — Progress Notes (Signed)
Pulmonary Critical Care Medicine [facility] [place]   PULMONARY CRITICAL CARE SERVICE  PROGRESS NOTE     Nialah Saravia  NFA:[id number]  DOB: May 18, 1956   DOA: 10/28/2021  Referring Physician: Luna Kitchens, MD  HPI: Lori Kemp is a 66 y.o. female being followed for ventilator/airway/oxygen weaning Acute on Chronic Respiratory Failure.  Patient is on the ventilator and full support assist control mode after having failed the 24-hour attempt.  Patient had gotten acidotic and had to go back on the ventilator.  Medications: Reviewed on Rounds  Physical Exam:  Vitals: Temperature 98.2 pulse 104 respiratory to is 18 blood pressure is 139/73 saturations 97%  Ventilator Settings on assist control FiO2 is 28% tidal volume 350 PEEP 5  General: Comfortable at this time Neck: supple Cardiovascular: no malignant arrhythmias Respiratory: Scattered rhonchi very coarse breath sounds Skin: no rash seen on limited exam Musculoskeletal: No gross abnormality Psychiatric:unable to assess Neurologic:no involuntary movements         Lab Data:   Basic Metabolic Panel: Recent Labs  Lab 11/02/21 0614 11/05/21 0334 11/05/21 0910 11/07/21 0652 11/08/21 0408  NA 137 142  --  146*  --   K 4.8 5.3* 5.0 5.2* 4.6  CL 100 100  --  101  --   CO2 33* 36*  --  40*  --   GLUCOSE 125* 129*  --  114*  --   BUN 28* 32*  --  55*  --   CREATININE 0.46 0.52  --  0.79  --   CALCIUM 8.8* 9.2  --  9.8  --   MG 1.8 2.0  --  2.1  --     ABG: Recent Labs  Lab 11/06/21 0925 11/06/21 1814 11/07/21 0518  PHART 7.145* 7.380 7.369  PCO2ART >120.0* 66.7* 68.4*  PO2ART 114* 76.7* 112*  HCO3 41.1* 39.1* 38.4*  O2SAT 97.8 97.3 98.7    Liver Function Tests: No results for input(s): AST, ALT, ALKPHOS, BILITOT, PROT, ALBUMIN in the last 168 hours. No results for input(s): LIPASE, AMYLASE in the last 168 hours. No results for input(s): AMMONIA in the last 168 hours.  CBC: Recent Labs   Lab 11/02/21 0614 11/05/21 0334 11/07/21 0652  WBC 5.4 4.9 6.5  HGB 8.9* 10.3* 9.2*  HCT 29.8* 34.1* 30.9*  MCV 99.0 100.6* 99.7  PLT 194 172 157    Cardiac Enzymes: No results for input(s): CKTOTAL, CKMB, CKMBINDEX, TROPONINI in the last 168 hours.  BNP (last 3 results) No results for input(s): BNP in the last 8760 hours.  ProBNP (last 3 results) No results for input(s): PROBNP in the last 8760 hours.  Radiological Exams: CT HEAD WO CONTRAST ( )  Result Date: 11/07/2021 CLINICAL DATA:  Seizure, new-onset, no history of trauma evaluate for acute intracranial process EXAM: CT HEAD WITHOUT CONTRAST TECHNIQUE: Contiguous axial images were obtained from the base of the skull through the vertex without intravenous contrast. RADIATION DOSE REDUCTION: This exam was performed according to the departmental dose-optimization program which includes automated exposure control, adjustment of the mA and/or kV according to patient size and/or use of iterative reconstruction technique. COMPARISON:  None. BRAIN: BRAIN Patchy and confluent areas of decreased attenuation are noted throughout the deep and periventricular white matter of the cerebral hemispheres bilaterally, compatible with chronic microvascular ischemic disease. Limited evaluation of the cerebellum due to streak artifact. No evidence of large-territorial acute infarction. No parenchymal hemorrhage. No mass lesion. No extra-axial collection. No mass effect or midline shift.  No hydrocephalus. Basilar cisterns are patent. Vascular: No hyperdense vessel. Skull: No acute fracture or focal lesion. Cervical and occipital skull surgical hardware. Sinuses/Orbits: Paranasal sinuses and mastoid air cells are clear. The orbits are unremarkable. Other: None. IMPRESSION: No acute intracranial abnormality. Electronically Signed   By: Tish Frederickson M.D.   On: 11/07/2021 17:38   CT CHEST WO CONTRAST  Result Date: 11/07/2021 CLINICAL DATA:  Pleural  effusion. EXAM: CT CHEST WITHOUT CONTRAST TECHNIQUE: Multidetector CT imaging of the chest was performed following the standard protocol without IV contrast. RADIATION DOSE REDUCTION: This exam was performed according to the departmental dose-optimization program which includes automated exposure control, adjustment of the mA and/or kV according to patient size and/or use of iterative reconstruction technique. COMPARISON:  Chest x-ray 11/07/2021. FINDINGS: Cardiovascular: Heart and aorta are normal in size. There is no pericardial effusion. There are atherosclerotic calcifications of the aorta and coronary arteries. Right-sided central venous catheter tip ends in the distal SVC. Mediastinum/Nodes: No enlarged mediastinal or axillary lymph nodes. Thyroid gland, trachea, and esophagus demonstrate no significant findings. Lungs/Pleura: There are small to moderate-sized bilateral pleural effusions. There is compressive atelectasis within the bilateral lower lobes. There is additional consolidation with air bronchograms in the right lower lobe. There are few peripheral reticular opacities in the right upper lobe. The tip of the tracheostomy is at the level of the clavicular heads. There secretions in the left mainstem bronchus. Secretions are also seen within the bilateral lower lobe bronchi with some occlusions in right lower lobe bronchi. There is no evidence for pneumothorax. There is a calcified granuloma in the left upper lobe. Upper Abdomen: Left adrenal nodule measures 1.7 x 2.3 cm and is incompletely imaged. Limited evaluation of the upper abdomen is otherwise within normal limits. Musculoskeletal: No acute fractures are seen. T1-T2 posterior fusion hardware is visualized. IMPRESSION: 1. Small to moderate size bilateral pleural effusions. 2. Bilateral lower lobe atelectasis with airspace consolidation throughout the right lower lobe worrisome for pneumonia/aspiration. 3. There is plugging of bilateral lower lobe  bronchi, right greater than left. There also secretions in the left mainstem bronchus. 4. Indeterminate left adrenal nodule. Recommend further evaluation with adrenal CT or MRI. Aortic Atherosclerosis (ICD10-I70.0). Electronically Signed   By: Darliss Cheney M.D.   On: 11/07/2021 17:41   DG CHEST PORT 1 VIEW  Result Date: 11/07/2021 CLINICAL DATA:  Respiratory failure EXAM: PORTABLE CHEST 1 VIEW COMPARISON:  Ten days ago FINDINGS: Continued hazy appearance of the right more than left chest including pleural fluid on the right. Normal heart size and mediastinal contours. Right PICC with tip at the SVC. Artifact from EKG leads. Tracheostomy tube with tip just below the clavicular heads. Cervical spine fusion. IMPRESSION: Unchanged hazy appearance of the right more than left chest usually from atelectasis and pleural fluid. Electronically Signed   By: Tiburcio Pea M.D.   On: 11/07/2021 06:11   ECHOCARDIOGRAM COMPLETE  Result Date: 11/07/2021    ECHOCARDIOGRAM REPORT   Patient Name:   Lori Kemp Date of Exam: 11/07/2021 Medical Rec #:  [id number]     Height: Accession #:    [id number]    Weight: Date of Birth:  [birth year]     BSA: Patient Age:    66 years      BP:           165/87 mmHg Patient Gender: F             HR:  94 bpm. Exam Location:  Inpatient Procedure: 2D Echo, Cardiac Doppler and Color Doppler Indications:     R06.00 DYSPNEA  History:         Patient has no prior history of Echocardiogram examinations.                  COPD and Stroke. HYPOXIA, RESP. FAILURE.  Sonographer:     Festus BarrenJeanine Russo Referring Phys:  [id number] Pietra Zuluaga A Callahan Wild Diagnosing Phys: Orpah CobbAjay Kadakia MD  Sonographer Comments: Echo performed with patient supine and on artificial respirator. NO SSN IMPRESSIONS  1. Left ventricular ejection fraction, by estimation, is 65 to 70%. The left ventricle has normal function. The left ventricle has no regional wall motion abnormalities. Left ventricular diastolic parameters are consistent  with Grade I diastolic dysfunction (impaired relaxation).  2. Right ventricular systolic function is normal. The right ventricular size is normal.  3. Left atrial size was moderately dilated.  4. Right atrial size was mildly dilated.  5. The mitral valve is degenerative. Mild mitral valve regurgitation.  6. The aortic valve is tricuspid. There is mild calcification of the aortic valve. There is mild thickening of the aortic valve. Aortic valve regurgitation is trivial. Aortic valve sclerosis is present, with no evidence of aortic valve stenosis.  7. There is mild (Grade II) atheroma plaque involving the aortic root and ascending aorta.  8. The inferior vena cava is normal in size with <50% respiratory variability, suggesting right atrial pressure of 8 mmHg. FINDINGS  Left Ventricle: LV outflow tract mild obstruction. Left ventricular ejection fraction, by estimation, is 65 to 70%. The left ventricle has normal function. The left ventricle has no regional wall motion abnormalities. The left ventricular internal cavity size was normal in size. There is borderline asymmetric left ventricular hypertrophy of the infero-lateral segment. Left ventricular diastolic parameters are consistent with Grade I diastolic dysfunction (impaired relaxation). Right Ventricle: The right ventricular size is normal. No increase in right ventricular wall thickness. Right ventricular systolic function is normal. Left Atrium: Left atrial size was moderately dilated. Right Atrium: Right atrial size was mildly dilated. Pericardium: There is no evidence of pericardial effusion. Mitral Valve: The mitral valve is degenerative in appearance. Mild mitral valve regurgitation. Tricuspid Valve: The tricuspid valve is normal in structure. Tricuspid valve regurgitation is trivial. Aortic Valve: The aortic valve is tricuspid. There is mild calcification of the aortic valve. There is mild thickening of the aortic valve. There is mild aortic valve annular  calcification. Aortic valve regurgitation is trivial. Aortic valve sclerosis is  present, with no evidence of aortic valve stenosis. Aortic valve mean gradient measures 4.0 mmHg. Aortic valve peak gradient measures 6.5 mmHg. Aortic valve area, by VTI measures 1.18 cm. Pulmonic Valve: The pulmonic valve was normal in structure. Pulmonic valve regurgitation is trivial. Aorta: The aortic root is normal in size and structure. There is mild (Grade II) atheroma plaque involving the aortic root and ascending aorta. Venous: The inferior vena cava is normal in size with less than 50% respiratory variability, suggesting right atrial pressure of 8 mmHg. IAS/Shunts: The atrial septum is grossly normal.  LEFT VENTRICLE PLAX 2D LVIDd:         3.50 cm LVIDs:         2.00 cm LV PW:         0.80 cm LV IVS:        0.80 cm LVOT diam:     1.80 cm LV SV:  36 LVOT Area:     2.54 cm  LV Volumes (MOD) LV vol d, MOD A2C: 22.9 ml LV vol d, MOD A4C: 24.5 ml LV vol s, MOD A2C: 5.6 ml LV vol s, MOD A4C: 12.9 ml LV SV MOD A2C:     17.3 ml LV SV MOD A4C:     24.5 ml LV SV MOD BP:      15.2 ml RIGHT VENTRICLE             IVC RV S prime:     14.50 cm/s  IVC diam: 1.70 cm TAPSE (M-mode): 2.0 cm LEFT ATRIUM             RIGHT ATRIUM LA diam:        2.80 cm RA Area:     6.74 cm LA Vol (A2C):   52.3 ml RA Volume:   13.20 ml LA Vol (A4C):   55.9 ml LA Biplane Vol: 56.1 ml  AORTIC VALVE                    PULMONIC VALVE AV Area (Vmax):    1.64 cm     PV Vmax:       1.06 m/s AV Area (Vmean):   1.38 cm     PV Vmean:      66.000 cm/s AV Area (VTI):     1.18 cm     PV VTI:        0.205 m AV Vmax:           127.00 cm/s  PV Peak grad:  4.5 mmHg AV Vmean:          96.000 cm/s  PV Mean grad:  2.0 mmHg AV VTI:            0.302 m AV Peak Grad:      6.5 mmHg AV Mean Grad:      4.0 mmHg LVOT Vmax:         81.90 cm/s LVOT Vmean:        52.100 cm/s LVOT VTI:          0.140 m LVOT/AV VTI ratio: 0.46  AORTA Ao Root diam: 2.70 cm Ao Asc diam:  2.50 cm MITRAL  VALVE                TRICUSPID VALVE MV Area (PHT): 4.21 cm     TV Peak grad:   28.9 mmHg MV Decel Time: 180 msec     TV Mean grad:   22.0 mmHg MV E velocity: 105.00 cm/s  TV Vmax:        2.69 m/s MV A velocity: 140.00 cm/s  TV Vmean:       224.0 cm/s MV E/A ratio:  0.75         TV VTI:         0.73 msec                              SHUNTS                             Systemic VTI:  0.14 m                             Systemic Diam: 1.80 cm Orpah CobbAjay Kadakia MD Electronically signed by Orpah CobbAjay Kadakia MD Signature Date/Time:  11/07/2021/8:16:53 PM    Final     Assessment/Plan Active Problems:   Acute on chronic respiratory failure with hypoxia (HCC)   Abscess in epidural space of cervical spine   Tracheostomy status (HCC)   COPD, severe (HCC)   Bilateral pleural effusion   Acute on chronic respiratory failure hypoxia plan is going to be to continue with on full support on mechanical ventilation assist control mode. Epidural abscess supportive care prognosis guarded Tracheostomy will remain in place Severe COPD medical management Bilateral effusions no change CT scan had revealed presence of significant effusions we will go ahead and order thoracentesis also CT scan had some retained secretions needs ongoing aggressive pulmonary toilet   I have personally seen and evaluated the patient, evaluated laboratory and imaging results, formulated the assessment and plan and placed orders. The Patient requires high complexity decision making with multiple systems involvement.  Rounds were done with the Respiratory Therapy Director and Staff therapists and discussed with nursing staff also.  Yevonne Pax, MD [facility] Pulmonary Critical Care Medicine Sleep Medicine

## 2021-11-08 NOTE — Procedures (Signed)
PROCEDURE SUMMARY:  Successful US guided right thoracentesis. Yielded 375 mL of clear yellow fluid. Pt tolerated procedure well. No immediate complications.  Specimen was sent for labs. CXR ordered.  EBL < 5 mL  Brayton El PA-C 11/08/2021 1:45 PM

## 2021-11-09 DIAGNOSIS — J90 Pleural effusion, not elsewhere classified: Secondary | ICD-10-CM | POA: Diagnosis not present

## 2021-11-09 DIAGNOSIS — J449 Chronic obstructive pulmonary disease, unspecified: Secondary | ICD-10-CM | POA: Diagnosis not present

## 2021-11-09 DIAGNOSIS — J9621 Acute and chronic respiratory failure with hypoxia: Secondary | ICD-10-CM | POA: Diagnosis not present

## 2021-11-09 DIAGNOSIS — G061 Intraspinal abscess and granuloma: Secondary | ICD-10-CM | POA: Diagnosis not present

## 2021-11-09 NOTE — Progress Notes (Signed)
Pulmonary [place]   PULMONARY CRITICAL CARE SERVICE  PROGRESS NOTE     Lori Kemp  [id number]  DOB: 11/16/1955   DOA: 10/28/2021  Referring Physician: Satira Sark, MD  HPI: Lori Kemp is a 66 y.o. female being followed for ventilator/airway/oxygen weaning Acute on Chronic Respiratory Failure.  Patient had thoracentesis done with removal of 375 cc of fluid patient is still on the ventilator has not been tolerating weaning attempts today  Medications: Reviewed on Rounds  Physical Exam:  Vitals: Temperature 98.6 pulse 101 respiratory rate is 18 blood pressure is 126/64 saturations 97%  Ventilator Settings are assist-control FiO2 28% tidal volume 350 PEEP 5  General: Comfortable at this time Neck: supple Cardiovascular: no malignant arrhythmias Respiratory: Scattered coarse rhonchi are noted bilaterally Skin: no rash seen on limited exam Musculoskeletal: No gross abnormality Psychiatric:unable to assess Neurologic:no involuntary movements         Lab Data:   Basic Metabolic Panel: Recent Labs  Lab 11/05/21 0334 11/05/21 0910 11/07/21 0652 11/08/21 0408  NA 142  --  146*  --   K 5.3* 5.0 5.2* 4.6  CL 100  --  101  --   CO2 36*  --  40*  --   GLUCOSE 129*  --  114*  --   BUN 32*  --  55*  --   CREATININE 0.52  --  0.79  --   CALCIUM 9.2  --  9.8  --   MG 2.0  --  2.1  --     ABG: Recent Labs  Lab 11/06/21 0925 11/06/21 1814 11/07/21 0518  PHART 7.145* 7.380 7.369  PCO2ART >120.0* 66.7* 68.4*  PO2ART 114* 76.7* 112*  HCO3 41.1* 39.1* 38.4*  O2SAT 97.8 97.3 98.7    Liver Function Tests: No results for input(s): AST, ALT, ALKPHOS, BILITOT, PROT, ALBUMIN in the last 168 hours. No results for input(s): LIPASE, AMYLASE in the last 168 hours. No results for input(s): AMMONIA in the last 168 hours.  CBC: Recent Labs  Lab 11/05/21 0334 11/07/21 0652  WBC 4.9 6.5  HGB 10.3* 9.2*  HCT 34.1*  30.9*  MCV 100.6* 99.7  PLT 172 157    Cardiac Enzymes: No results for input(s): CKTOTAL, CKMB, CKMBINDEX, TROPONINI in the last 168 hours.  BNP (last 3 results) No results for input(s): BNP in the last 8760 hours.  ProBNP (last 3 results) No results for input(s): PROBNP in the last 8760 hours.  Radiological Exams: CT HEAD WO CONTRAST (5MM)  Result Date: 11/07/2021 CLINICAL DATA:  Seizure, new-onset, no history of trauma evaluate for acute intracranial process EXAM: CT HEAD WITHOUT CONTRAST TECHNIQUE: Contiguous axial images were obtained from the base of the skull through the vertex without intravenous contrast. RADIATION DOSE REDUCTION: This exam was performed according to the departmental dose-optimization program which includes automated exposure control, adjustment of the mA and/or kV according to patient size and/or use of iterative reconstruction technique. COMPARISON:  None. BRAIN: BRAIN Patchy and confluent areas of decreased attenuation are noted throughout the deep and periventricular white matter of the cerebral hemispheres bilaterally, compatible with chronic microvascular ischemic disease. Limited evaluation of the cerebellum due to streak artifact. No evidence of large-territorial acute infarction. No parenchymal hemorrhage. No mass lesion. No extra-axial collection. No mass effect or midline shift. No hydrocephalus. Basilar cisterns are patent. Vascular: No hyperdense vessel. Skull: No acute fracture or focal lesion. Cervical and occipital skull surgical hardware. Sinuses/Orbits: Paranasal sinuses  and mastoid air cells are clear. The orbits are unremarkable. Other: None. IMPRESSION: No acute intracranial abnormality. Electronically Signed   By: Iven Finn M.D.   On: 11/07/2021 17:38   CT CHEST WO CONTRAST  Result Date: 11/07/2021 CLINICAL DATA:  Pleural effusion. EXAM: CT CHEST WITHOUT CONTRAST TECHNIQUE: Multidetector CT imaging of the chest was performed following the  standard protocol without IV contrast. RADIATION DOSE REDUCTION: This exam was performed according to the departmental dose-optimization program which includes automated exposure control, adjustment of the mA and/or kV according to patient size and/or use of iterative reconstruction technique. COMPARISON:  Chest x-ray 11/07/2021. FINDINGS: Cardiovascular: Heart and aorta are normal in size. There is no pericardial effusion. There are atherosclerotic calcifications of the aorta and coronary arteries. Right-sided central venous catheter tip ends in the distal SVC. Mediastinum/Nodes: No enlarged mediastinal or axillary lymph nodes. Thyroid gland, trachea, and esophagus demonstrate no significant findings. Lungs/Pleura: There are small to moderate-sized bilateral pleural effusions. There is compressive atelectasis within the bilateral lower lobes. There is additional consolidation with air bronchograms in the right lower lobe. There are few peripheral reticular opacities in the right upper lobe. The tip of the tracheostomy is at the level of the clavicular heads. There secretions in the left mainstem bronchus. Secretions are also seen within the bilateral lower lobe bronchi with some occlusions in right lower lobe bronchi. There is no evidence for pneumothorax. There is a calcified granuloma in the left upper lobe. Upper Abdomen: Left adrenal nodule measures 1.7 x 2.3 cm and is incompletely imaged. Limited evaluation of the upper abdomen is otherwise within normal limits. Musculoskeletal: No acute fractures are seen. T1-T2 posterior fusion hardware is visualized. IMPRESSION: 1. Small to moderate size bilateral pleural effusions. 2. Bilateral lower lobe atelectasis with airspace consolidation throughout the right lower lobe worrisome for pneumonia/aspiration. 3. There is plugging of bilateral lower lobe bronchi, right greater than left. There also secretions in the left mainstem bronchus. 4. Indeterminate left adrenal  nodule. Recommend further evaluation with adrenal CT or MRI. Aortic Atherosclerosis (ICD10-I70.0). Electronically Signed   By: Ronney Asters M.D.   On: 11/07/2021 17:41   MR ANGIO NECK W WO CONTRAST  Result Date: 11/08/2021 CLINICAL DATA:  Vertebral artery dissection suspected. EXAM: MRA NECK WITHOUT AND WITH CONTRAST TECHNIQUE: Multiplanar and multiecho pulse sequences of the neck were obtained without and with intravenous contrast. Angiographic images of the neck were obtained using MRA technique without and with intravenous contrast. CONTRAST:  55mL GADAVIST GADOBUTROL 1 MMOL/ML IV SOLN COMPARISON:  Report from 10/24/2021 neck CTA from [facility] FINDINGS: There is a standard 3 vessel aortic arch. The brachiocephalic and subclavian arteries are patent without evidence of a significant stenosis. The bilateral common carotid arteries and left cervical internal carotid artery are patent and smooth without evidence of a significant stenosis or dissection. The right cervical ICA is patent with mild irregularity in its midportion without result in significant stenosis. There is diminished opacification of the right V1 and proximal V2 segments with evidence of occlusion of the more distal V2 segment and reconstitution of the V3 and V4 segments which is similar to what was described on the prior outside neck CTA report. The left vertebral artery is patent proximally, however there is occlusion of the distal V2 and proximal V3 segments with reconstitution of the more distal V3 and V4 segments, also similar to what was described in the prior outside report. There is mild-to-moderate stenosis versus motion artifact through the left vertebral origin.  IMPRESSION: 1. 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 2. Patent cervical carotid arteries without significant stenosis. Electronically Signed   By: Logan Bores M.D.   On: 11/08/2021 18:53   DG Chest Port 1 View  Result  Date: 11/08/2021 CLINICAL DATA:  Right pleural effusion, status post thoracentesis EXAM: PORTABLE CHEST 1 VIEW COMPARISON:  11/07/2021 FINDINGS: Nearly complete resolution of previously seen layering right pleural effusion following thoracentesis. No significant pneumothorax. Tracheostomy. Right upper extremity PICC. IMPRESSION: Nearly complete resolution of previously seen layering right pleural effusion following thoracentesis. No significant pneumothorax. Electronically Signed   By: Delanna Ahmadi M.D.   On: 11/08/2021 14:41   ECHOCARDIOGRAM COMPLETE  Result Date: 11/07/2021    ECHOCARDIOGRAM REPORT   Patient Name:   Teiona Solivan Date of Exam: 11/07/2021 Medical Rec #:  [id number]     Height: Accession #:    [id number]    Weight: Date of Birth:  [birth year]     BSA: Patient Age:    34 years      BP:           165/87 mmHg Patient Gender: F             HR:           94 bpm. Exam Location:  Inpatient Procedure: 2D Echo, Cardiac Doppler and Color Doppler Indications:     R06.00 DYSPNEA  History:         Patient has no prior history of Echocardiogram examinations.                  COPD and Stroke. HYPOXIA, RESP. FAILURE.  Sonographer:     Beryle Beams Referring Phys:  [id number] Avyon Herendeen A Eryck Negron Diagnosing Phys: Dixie Dials MD  Sonographer Comments: Echo performed with patient supine and on artificial respirator. NO SSN IMPRESSIONS  1. Left ventricular ejection fraction, by estimation, is 65 to 70%. The left ventricle has normal function. The left ventricle has no regional wall motion abnormalities. Left ventricular diastolic parameters are consistent with Grade I diastolic dysfunction (impaired relaxation).  2. Right ventricular systolic function is normal. The right ventricular size is normal.  3. Left atrial size was moderately dilated.  4. Right atrial size was mildly dilated.  5. The mitral valve is degenerative. Mild mitral valve regurgitation.  6. The aortic valve is tricuspid. There is mild calcification of the  aortic valve. There is mild thickening of the aortic valve. Aortic valve regurgitation is trivial. Aortic valve sclerosis is present, with no evidence of aortic valve stenosis.  7. There is mild (Grade II) atheroma plaque involving the aortic root and ascending aorta.  8. The inferior vena cava is normal in size with <50% respiratory variability, suggesting right atrial pressure of 8 mmHg. FINDINGS  Left Ventricle: LV outflow tract mild obstruction. Left ventricular ejection fraction, by estimation, is 65 to 70%. The left ventricle has normal function. The left ventricle has no regional wall motion abnormalities. The left ventricular internal cavity size was normal in size. There is borderline asymmetric left ventricular hypertrophy of the infero-lateral segment. Left ventricular diastolic parameters are consistent with Grade I diastolic dysfunction (impaired relaxation). Right Ventricle: The right ventricular size is normal. No increase in right ventricular wall thickness. Right ventricular systolic function is normal. Left Atrium: Left atrial size was moderately dilated. Right Atrium: Right atrial size was mildly dilated. Pericardium: There is no evidence of pericardial effusion. Mitral Valve: The mitral valve is degenerative  in appearance. Mild mitral valve regurgitation. Tricuspid Valve: The tricuspid valve is normal in structure. Tricuspid valve regurgitation is trivial. Aortic Valve: The aortic valve is tricuspid. There is mild calcification of the aortic valve. There is mild thickening of the aortic valve. There is mild aortic valve annular calcification. Aortic valve regurgitation is trivial. Aortic valve sclerosis is  present, with no evidence of aortic valve stenosis. Aortic valve mean gradient measures 4.0 mmHg. Aortic valve peak gradient measures 6.5 mmHg. Aortic valve area, by VTI measures 1.18 cm. Pulmonic Valve: The pulmonic valve was normal in structure. Pulmonic valve regurgitation is trivial.  Aorta: The aortic root is normal in size and structure. There is mild (Grade II) atheroma plaque involving the aortic root and ascending aorta. Venous: The inferior vena cava is normal in size with less than 50% respiratory variability, suggesting right atrial pressure of 8 mmHg. IAS/Shunts: The atrial septum is grossly normal.  LEFT VENTRICLE PLAX 2D LVIDd:         3.50 cm LVIDs:         2.00 cm LV PW:         0.80 cm LV IVS:        0.80 cm LVOT diam:     1.80 cm LV SV:         36 LVOT Area:     2.54 cm  LV Volumes (MOD) LV vol d, MOD A2C: 22.9 ml LV vol d, MOD A4C: 24.5 ml LV vol s, MOD A2C: 5.6 ml LV vol s, MOD A4C: 12.9 ml LV SV MOD A2C:     17.3 ml LV SV MOD A4C:     24.5 ml LV SV MOD BP:      15.2 ml RIGHT VENTRICLE             IVC RV S prime:     14.50 cm/s  IVC diam: 1.70 cm TAPSE (M-mode): 2.0 cm LEFT ATRIUM             RIGHT ATRIUM LA diam:        2.80 cm RA Area:     6.74 cm LA Vol (A2C):   52.3 ml RA Volume:   13.20 ml LA Vol (A4C):   55.9 ml LA Biplane Vol: 56.1 ml  AORTIC VALVE                    PULMONIC VALVE AV Area (Vmax):    1.64 cm     PV Vmax:       1.06 m/s AV Area (Vmean):   1.38 cm     PV Vmean:      66.000 cm/s AV Area (VTI):     1.18 cm     PV VTI:        0.205 m AV Vmax:           127.00 cm/s  PV Peak grad:  4.5 mmHg AV Vmean:          96.000 cm/s  PV Mean grad:  2.0 mmHg AV VTI:            0.302 m AV Peak Grad:      6.5 mmHg AV Mean Grad:      4.0 mmHg LVOT Vmax:         81.90 cm/s LVOT Vmean:        52.100 cm/s LVOT VTI:          0.140 m LVOT/AV VTI ratio: 0.46  AORTA Ao  Root diam: 2.70 cm Ao Asc diam:  2.50 cm MITRAL VALVE                TRICUSPID VALVE MV Area (PHT): 4.21 cm     TV Peak grad:   28.9 mmHg MV Decel Time: 180 msec     TV Mean grad:   22.0 mmHg MV E velocity: 105.00 cm/s  TV Vmax:        2.69 m/s MV A velocity: 140.00 cm/s  TV Vmean:       224.0 cm/s MV E/A ratio:  0.75         TV VTI:         0.73 msec                              SHUNTS                              Systemic VTI:  0.14 m                             Systemic Diam: 1.80 cm Dixie Dials MD Electronically signed by Dixie Dials MD Signature Date/Time: 11/07/2021/8:16:53 PM    Final    IR THORACENTESIS ASP PLEURAL SPACE W/IMG GUIDE  Result Date: 11/08/2021 INDICATION: Respiratory failure. Right-sided pleural effusion. Request for diagnostic and therapeutic thoracentesis. EXAM: ULTRASOUND GUIDED RIGHT THORACENTESIS MEDICATIONS: 1% plain lidocaine, 3 mL COMPLICATIONS: None immediate. PROCEDURE: An ultrasound guided thoracentesis was thoroughly discussed with the patient and questions answered. The benefits, risks, alternatives and complications were also discussed. The patient understands and wishes to proceed with the procedure. Written consent was obtained. Ultrasound was performed to localize and mark an adequate pocket of fluid in the right chest. The area was then prepped and draped in the normal sterile fashion. 1% Lidocaine was used for local anesthesia. Under ultrasound guidance a 6 Fr Safe-T-Centesis catheter was introduced. Thoracentesis was performed. The catheter was removed and a dressing applied. FINDINGS: A total of approximately 375 mL of clear yellow fluid was removed. Samples were sent to the laboratory as requested by the clinical team. IMPRESSION: Successful ultrasound guided right thoracentesis yielding 375 mL of pleural fluid. Read by: Ascencion Dike PA-C Electronically Signed   By: Jerilynn Mages.  Shick M.D.   On: 11/08/2021 16:15    Assessment/Plan Active Problems:   Acute on chronic respiratory failure with hypoxia (HCC)   Abscess in epidural space of cervical spine   Tracheostomy status (HCC)   COPD, severe (HCC)   Bilateral pleural effusion   Acute on chronic respiratory failure hypoxia plan continue with full support on the ventilator for now patient has failed once again on weaning attempt Epidural abscess prognosis guarded we will continue to monitor Severe COPD medical management  nebulizers as needed Tracheostomy remains in place Bilateral effusion status postthoracentesis   I have personally seen and evaluated the patient, evaluated laboratory and imaging results, formulated the assessment and plan and placed orders. The Patient requires high complexity decision making with multiple systems involvement.  Rounds were done with the Respiratory Therapy Director and Staff therapists and discussed with nursing staff also.  Allyne Gee, MD [facility] Pulmonary Critical Care Medicine Sleep Medicine

## 2021-11-10 ENCOUNTER — Other Ambulatory Visit (HOSPITAL_COMMUNITY): Payer: Medicare Other

## 2021-11-10 DIAGNOSIS — J449 Chronic obstructive pulmonary disease, unspecified: Secondary | ICD-10-CM | POA: Diagnosis not present

## 2021-11-10 DIAGNOSIS — G061 Intraspinal abscess and granuloma: Secondary | ICD-10-CM | POA: Diagnosis not present

## 2021-11-10 DIAGNOSIS — J9621 Acute and chronic respiratory failure with hypoxia: Secondary | ICD-10-CM | POA: Diagnosis not present

## 2021-11-10 DIAGNOSIS — J90 Pleural effusion, not elsewhere classified: Secondary | ICD-10-CM | POA: Diagnosis not present

## 2021-11-10 LAB — BLOOD GAS, ARTERIAL
Acid-Base Excess: 12.5 mmol/L — ABNORMAL HIGH (ref 0.0–2.0)
Bicarbonate: 37.9 mmol/L — ABNORMAL HIGH (ref 20.0–28.0)
FIO2: 28
O2 Saturation: 96.9 %
Patient temperature: 37
pCO2 arterial: 64 mmHg — ABNORMAL HIGH (ref 32.0–48.0)
pH, Arterial: 7.39 (ref 7.350–7.450)
pO2, Arterial: 90.1 mmHg (ref 83.0–108.0)

## 2021-11-10 IMAGING — MR MR NECK SOFT TISSUE ONLY WO/W CM
7 of 16 series · 18 of 48 positions shown · IV contrast (gadavist)
Comparison: No prior MRI of the neck

CLINICAL DATA: Vertebral fusion, history of vertebral artery
dissection

EXAM:
MRI OF THE NECK WITH CONTRAST
TECHNIQUE: Multiplanar, multisequence MR imaging was performed following the
administration of intravenous contrast.
CONTRAST:  6mL GADAVIST GADOBUTROL 1 MMOL/ML IV SOLN

[Series 7: T2 · sagittal · 3.0mm · 0.47mm/px · 3 of 36 slices shown (1 of 5)]
[im 1/36]
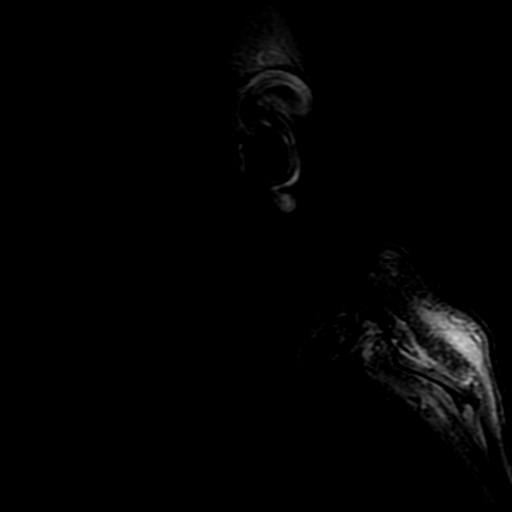
[im 18/36]
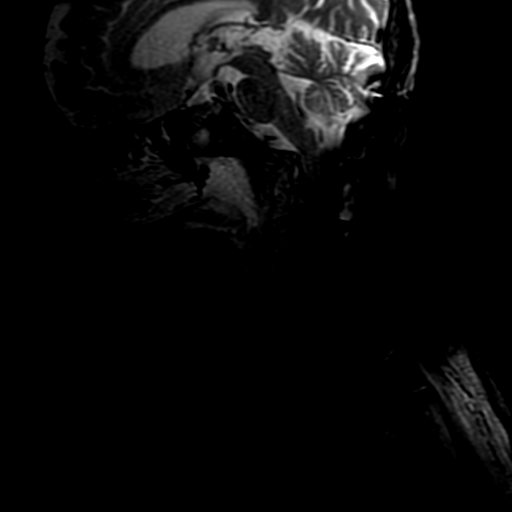
[im 36/36]
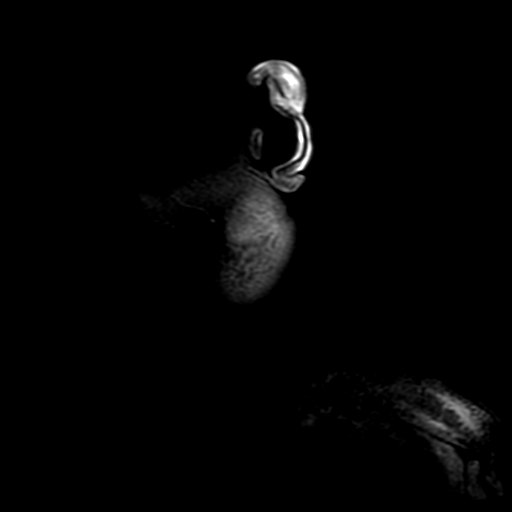

[Series 8: T2 · coronal · 3.0mm · 0.47mm/px · 3 of 45 slices shown (2 of 5)]
[im 1/45]
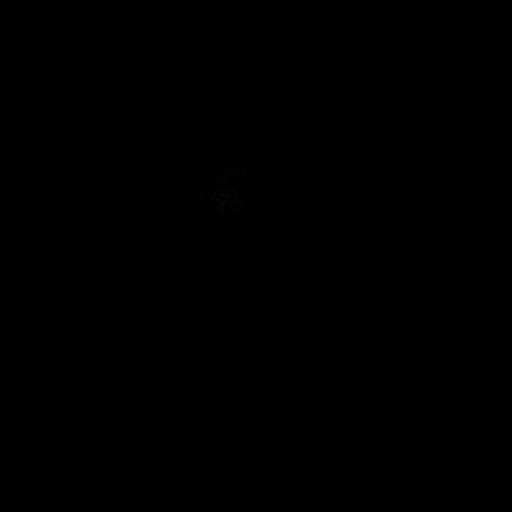
[im 23/45]
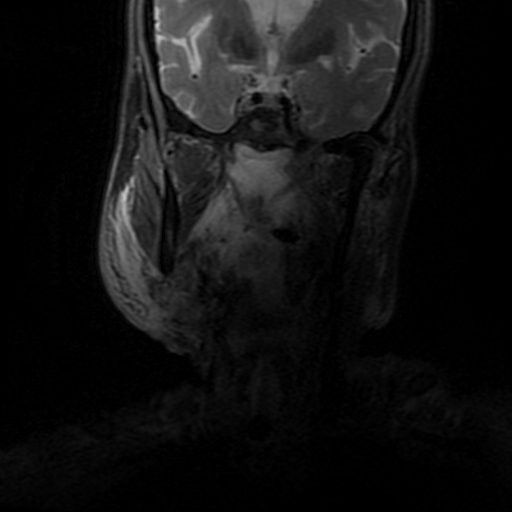
[im 45/45]
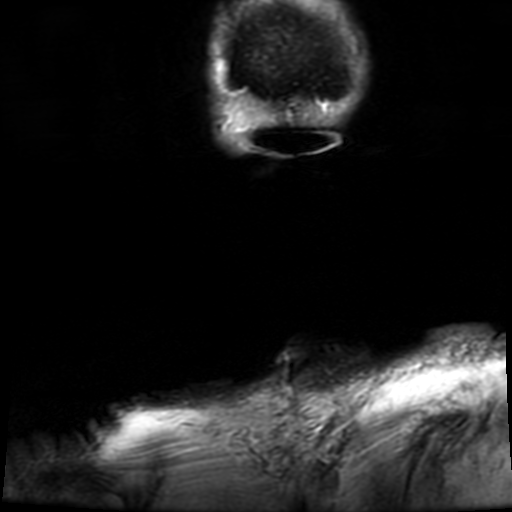

[Series 9: T2 · axial · 3.0mm · 0.66mm/px · z∈[-145,+36]mm · 3 of 49 slices shown (3 of 5)]
[im 1/49]
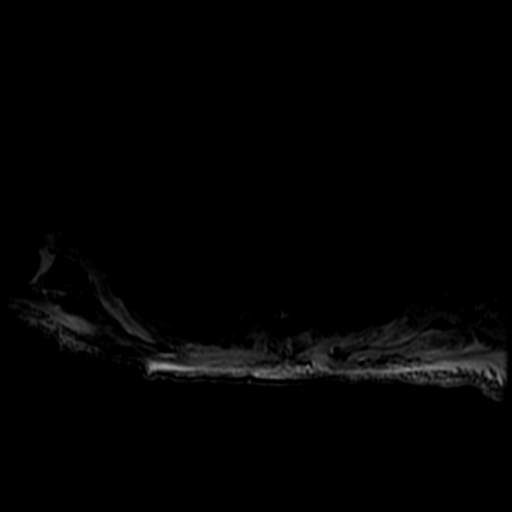
[im 25/49]
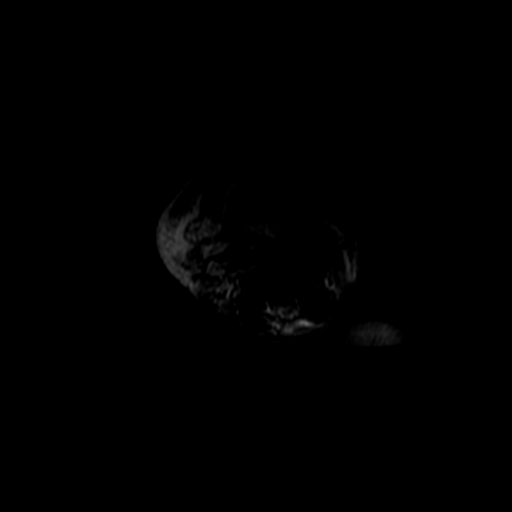
[im 49/49]
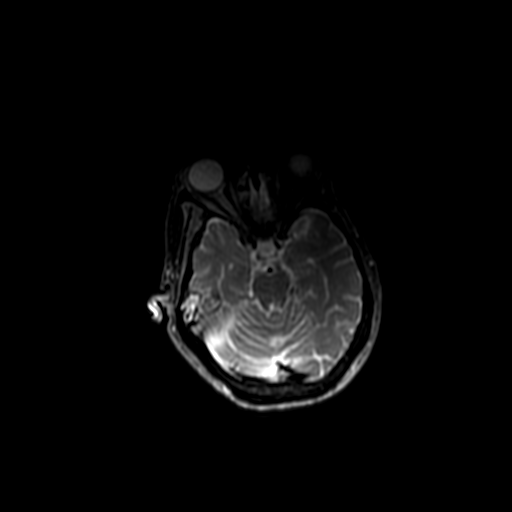

[Series 11: T1 post-contrast · sagittal · 3.0mm · 0.47mm/px · 2 of 36 slices shown (1 of 2)]
[im 1/36]
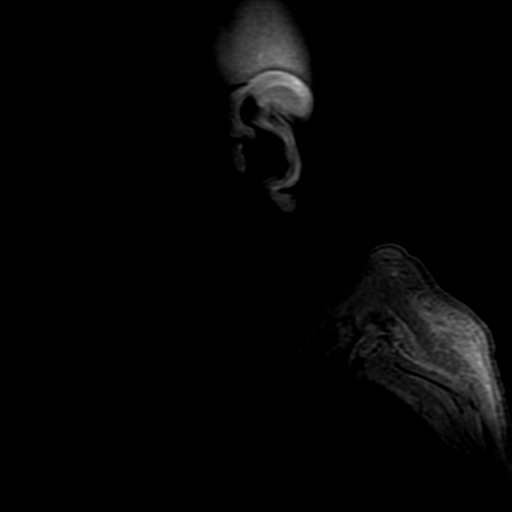
[im 36/36]
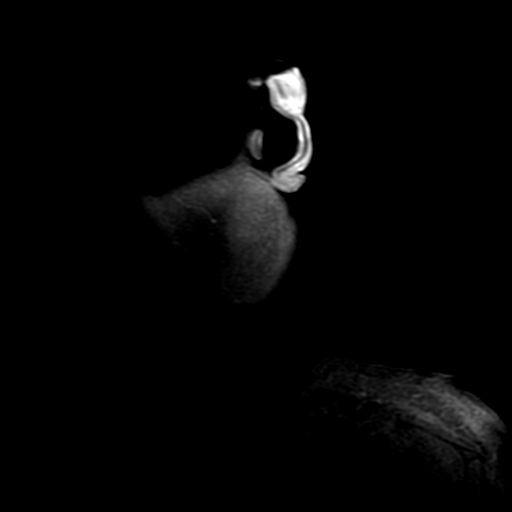

[Series 700: T2 · sagittal · 3.0mm · 0.47mm/px · 2 of 36 slices shown (4 of 5)]
[im 1/36]
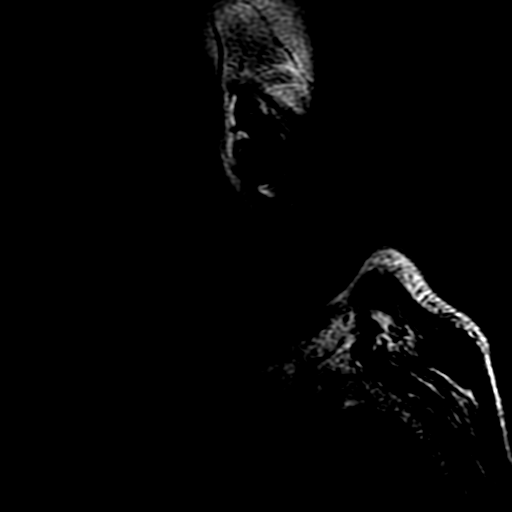
[im 36/36]
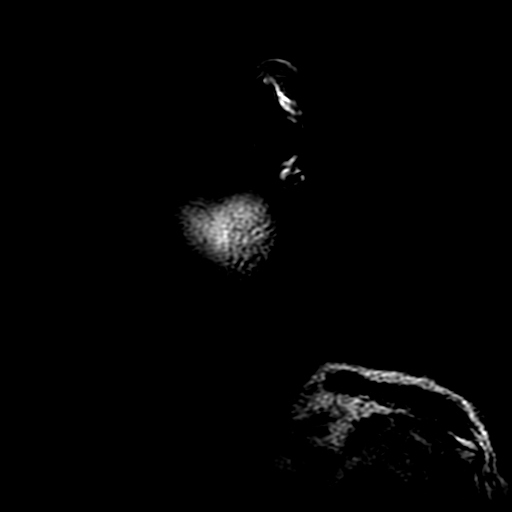

[Series 800: T2 · coronal · 3.0mm · 0.47mm/px · 3 of 45 slices shown (5 of 5)]
[im 1/45]
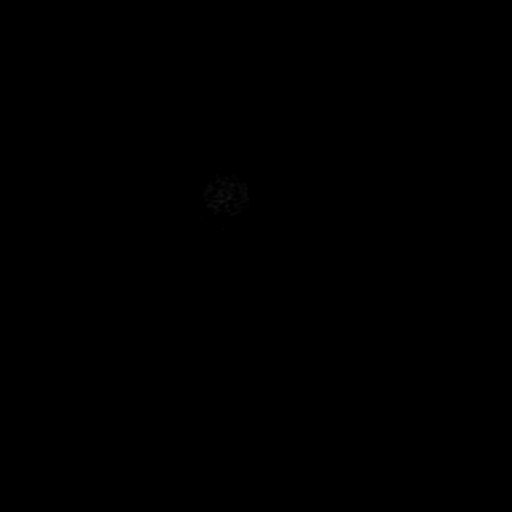
[im 23/45]
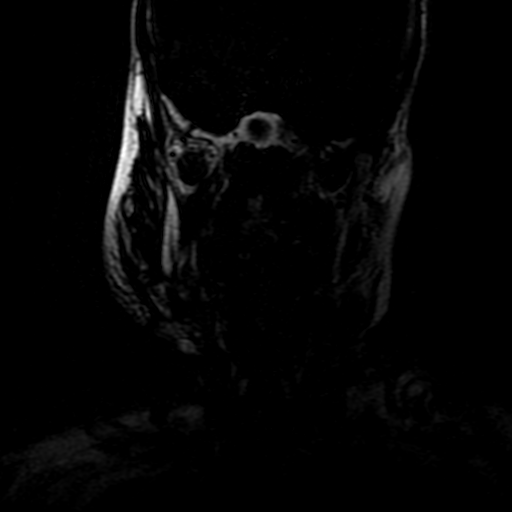
[im 45/45]
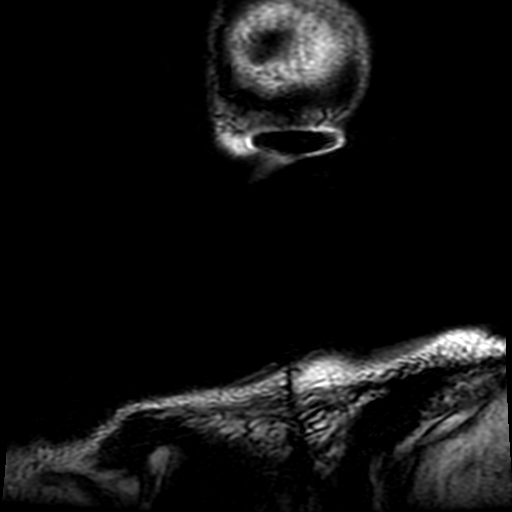

[Series 1100: T1 post-contrast · sagittal · 3.0mm · 0.47mm/px · 2 of 36 slices shown (2 of 2)]
[im 1/36]
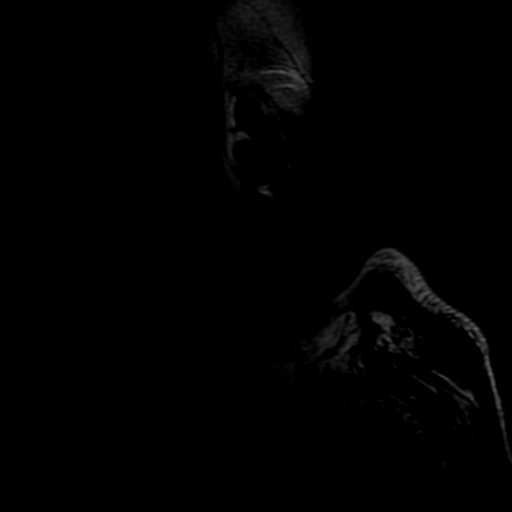
[im 36/36]
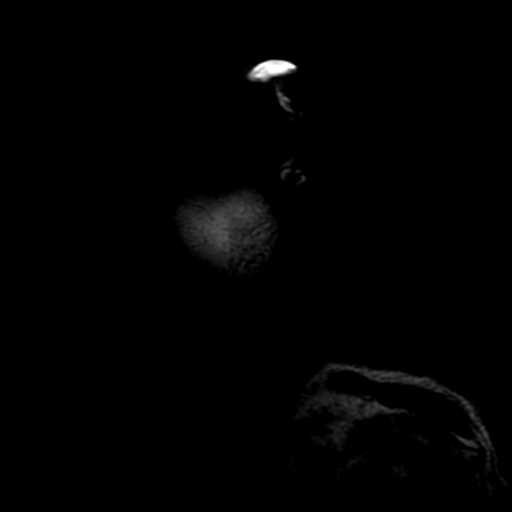

[18 of 48 positions shown; findings below may reference images not displayed]

FINDINGS: Evaluation is significantly limited by susceptibility artifact from
the patient's cervical fusion hardware. The images obtained reflect
the best efforts of the technologist. The post-contrast sequences
are particularly limited by artifact.

Pharynx and larynx: Status post tracheostomy. Mucosal edema in the
nasopharynx and right-greater-than-left oropharynx. No definite
mass.

Salivary glands: Poorly visualized.

Thyroid: Limited visualization; no definite abnormality.

Lymph nodes: No definite lymphadenopathy.

Vascular: Limited visualization demonstrates patent carotid artery
flow voids. The vertebral arteries are only identified in the
posterior fossa, where the flow voids appear patent.

Limited intracranial: Lacunar infarcts in the right cerebellar
hemisphere. Otherwise negative.

Visualized orbits: No acute abnormality.

Mastoids and visualized paranasal sinuses: Mucosal thickening, most
prominent in the ethmoid air cells and right sphenoid sinus. Fluid
throughout the right mastoid air cells. A small amount of fluid in
the left mastoid air cells.

Skeleton: Unable to evaluate the cervical spine and spinal canal
secondary to susceptibility artifact from hardware.

Upper chest: No pleural effusion.

Other: Increased T2 signal, likely edema in the soft tissues of the
right face and left neck.
IMPRESSION: 1. Evaluation is significantly limited by susceptibility artifact
from the patient's cervical fusion hardware, which particularly
limits evaluation of the spine and extracranial vertebral arteries.
The intracranial vertebral arteries are only visualized as flow
voids on the T2 weighted sequence, but these flow voids appear
patent in the V4 segments.
2. Mucosal edema in the nasopharynx and right-greater-than-left
oropharynx.
3. Increased T2 signal, possibly edema, in the soft tissues of the
right face and left neck. Correlate with physical exam.

## 2021-11-10 MED ORDER — GADOBUTROL 1 MMOL/ML IV SOLN
6.0000 mL | Freq: Once | INTRAVENOUS | Status: AC | PRN
  Administered 2021-11-10: 6 mL via INTRAVENOUS

## 2021-11-10 NOTE — Progress Notes (Signed)
Pulmonary Critical Care Medicine [facility] [place]   PULMONARY CRITICAL CARE SERVICE  PROGRESS NOTE     Lori Kemp  FKC:[id number]  DOB: 06/08/1956   DOA: 10/28/2021  Referring Physician: Luna Kitchens, MD  HPI: Lori Kemp is a 66 y.o. female being followed for ventilator/airway/oxygen weaning Acute on Chronic Respiratory Failure.  She has been having difficulty with spontaneous breathing her respiratory rate was set at 18 I asked for respiratory therapy to drop the rate down to 14 to see if she was able to trigger the ventilator  Medications: Reviewed on Rounds  Physical Exam:  Vitals: Temperature is 97 pulse 88 respiratory is 18 blood pressure is 94/58  Ventilator Settings on assist control FiO2 28% volume 387 PEEP 5  General: Comfortable at this time Neck: supple Cardiovascular: no malignant arrhythmias Respiratory: Scattered rhonchi expansion is equal Skin: no rash seen on limited exam Musculoskeletal: No gross abnormality Psychiatric:unable to assess Neurologic:no involuntary movements         Lab Data:   Basic Metabolic Panel: Recent Labs  Lab 11/05/21 0334 11/05/21 0910 11/07/21 0652 11/08/21 0408  NA 142  --  146*  --   K 5.3* 5.0 5.2* 4.6  CL 100  --  101  --   CO2 36*  --  40*  --   GLUCOSE 129*  --  114*  --   BUN 32*  --  55*  --   CREATININE 0.52  --  0.79  --   CALCIUM 9.2  --  9.8  --   MG 2.0  --  2.1  --     ABG: Recent Labs  Lab 11/06/21 0925 11/06/21 1814 11/07/21 0518  PHART 7.145* 7.380 7.369  PCO2ART >120.0* 66.7* 68.4*  PO2ART 114* 76.7* 112*  HCO3 41.1* 39.1* 38.4*  O2SAT 97.8 97.3 98.7    Liver Function Tests: No results for input(s): AST, ALT, ALKPHOS, BILITOT, PROT, ALBUMIN in the last 168 hours. No results for input(s): LIPASE, AMYLASE in the last 168 hours. No results for input(s): AMMONIA in the last 168 hours.  CBC: Recent Labs  Lab 11/05/21 0334 11/07/21 0652  WBC 4.9 6.5  HGB 10.3*  9.2*  HCT 34.1* 30.9*  MCV 100.6* 99.7  PLT 172 157    Cardiac Enzymes: No results for input(s): CKTOTAL, CKMB, CKMBINDEX, TROPONINI in the last 168 hours.  BNP (last 3 results) No results for input(s): BNP in the last 8760 hours.  ProBNP (last 3 results) No results for input(s): PROBNP in the last 8760 hours.  Radiological Exams: MR ANGIO NECK W WO CONTRAST  Result Date: 11/08/2021 CLINICAL DATA:  Vertebral artery dissection suspected. EXAM: MRA NECK WITHOUT AND WITH CONTRAST TECHNIQUE: Multiplanar and multiecho pulse sequences of the neck were obtained without and with intravenous contrast. Angiographic images of the neck were obtained using MRA technique without and with intravenous contrast. CONTRAST:  10mL GADAVIST GADOBUTROL 1 MMOL/ML IV SOLN COMPARISON:  Report from 10/24/2021 neck CTA from [facility] FINDINGS: There is a standard 3 vessel aortic arch. The brachiocephalic and subclavian arteries are patent without evidence of a significant stenosis. The bilateral common carotid arteries and left cervical internal carotid artery are patent and smooth without evidence of a significant stenosis or dissection. The right cervical ICA is patent with mild irregularity in its midportion without result in significant stenosis. There is diminished opacification of the right V1 and proximal V2 segments with evidence of occlusion of the more distal V2 segment and  reconstitution of the V3 and V4 segments which is similar to what was described on the prior outside neck CTA report. The left vertebral artery is patent proximally, however there is occlusion of the distal V2 and proximal V3 segments with reconstitution of the more distal V3 and V4 segments, also similar to what was described in the prior outside report. There is mild-to-moderate stenosis versus motion artifact through the left vertebral origin. IMPRESSION: 1. 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 2. Patent cervical carotid arteries without significant stenosis. Electronically Signed   By: Sebastian Ache M.D.   On: 11/08/2021 18:53   DG Chest Port 1 View  Result Date: 11/08/2021 CLINICAL DATA:  Right pleural effusion, status post thoracentesis EXAM: PORTABLE CHEST 1 VIEW COMPARISON:  11/07/2021 FINDINGS: Nearly complete resolution of previously seen layering right pleural effusion following thoracentesis. No significant pneumothorax. Tracheostomy. Right upper extremity PICC. IMPRESSION: Nearly complete resolution of previously seen layering right pleural effusion following thoracentesis. No significant pneumothorax. Electronically Signed   By: Jearld Lesch M.D.   On: 11/08/2021 14:41   IR THORACENTESIS ASP PLEURAL SPACE W/IMG GUIDE  Result Date: 11/08/2021 INDICATION: Respiratory failure. Right-sided pleural effusion. Request for diagnostic and therapeutic thoracentesis. EXAM: ULTRASOUND GUIDED RIGHT THORACENTESIS MEDICATIONS: 1% plain lidocaine, 3 mL COMPLICATIONS: None immediate. PROCEDURE: An ultrasound guided thoracentesis was thoroughly discussed with the patient and questions answered. The benefits, risks, alternatives and complications were also discussed. The patient understands and wishes to proceed with the procedure. Written consent was obtained. Ultrasound was performed to localize and mark an adequate pocket of fluid in the right chest. The area was then prepped and draped in the normal sterile fashion. 1% Lidocaine was used for local anesthesia. Under ultrasound guidance a 6 Fr Safe-T-Centesis catheter was introduced. Thoracentesis was performed. The catheter was removed and a dressing applied. FINDINGS: A total of approximately 375 mL of clear yellow fluid was removed. Samples were sent to the laboratory as requested by the clinical team. IMPRESSION: Successful ultrasound guided right thoracentesis yielding 375 mL of pleural fluid. Read by: Brayton El PA-C  Electronically Signed   By: Judie Petit.  Shick M.D.   On: 11/08/2021 16:15    Assessment/Plan Active Problems:   Acute on chronic respiratory failure with hypoxia (HCC)   Abscess in epidural space of cervical spine   Tracheostomy status (HCC)   COPD, severe (HCC)   Bilateral pleural effusion   Acute on chronic respiratory failure hypoxia plan continue with full support on the ventilator.  Patient is apparently not triggering the ventilator and so therefore I have asked for respiratory therapy to drop the respiratory rate down to see if patient is able to trigger Epidural abscess no change we will continue to monitor and follow along closely. Tracheostomy remains in place Severe COPD medical management Bilateral effusion no change we will continue to follow along closely.   I have personally seen and evaluated the patient, evaluated laboratory and imaging results, formulated the assessment and plan and placed orders. The Patient requires high complexity decision making with multiple systems involvement.  Rounds were done with the Respiratory Therapy Director and Staff therapists and discussed with nursing staff also.  Yevonne Pax, MD [facility] Pulmonary Critical Care Medicine Sleep Medicine

## 2021-11-11 DIAGNOSIS — J90 Pleural effusion, not elsewhere classified: Secondary | ICD-10-CM | POA: Diagnosis not present

## 2021-11-11 DIAGNOSIS — J449 Chronic obstructive pulmonary disease, unspecified: Secondary | ICD-10-CM | POA: Diagnosis not present

## 2021-11-11 DIAGNOSIS — J9621 Acute and chronic respiratory failure with hypoxia: Secondary | ICD-10-CM | POA: Diagnosis not present

## 2021-11-11 DIAGNOSIS — G061 Intraspinal abscess and granuloma: Secondary | ICD-10-CM | POA: Diagnosis not present

## 2021-11-11 LAB — CBC
HCT: 29.1 % — ABNORMAL LOW (ref 36.0–46.0)
Hemoglobin: 8.9 g/dL — ABNORMAL LOW (ref 12.0–15.0)
MCH: 30.3 pg (ref 26.0–34.0)
MCHC: 30.6 g/dL (ref 30.0–36.0)
MCV: 99 fL (ref 80.0–100.0)
Platelets: 149 10*3/uL — ABNORMAL LOW (ref 150–400)
RBC: 2.94 MIL/uL — ABNORMAL LOW (ref 3.87–5.11)
RDW: 16.2 % — ABNORMAL HIGH (ref 11.5–15.5)
WBC: 5.9 10*3/uL (ref 4.0–10.5)
nRBC: 0 % (ref 0.0–0.2)

## 2021-11-11 LAB — BASIC METABOLIC PANEL
Anion gap: 9 (ref 5–15)
BUN: 78 mg/dL — ABNORMAL HIGH (ref 8–23)
CO2: 36 mmol/L — ABNORMAL HIGH (ref 22–32)
Calcium: 9.4 mg/dL (ref 8.9–10.3)
Chloride: 100 mmol/L (ref 98–111)
Creatinine, Ser: 1.03 mg/dL — ABNORMAL HIGH (ref 0.44–1.00)
GFR, Estimated: 59 mL/min — ABNORMAL LOW (ref >60)
Glucose, Bld: 120 mg/dL — ABNORMAL HIGH (ref 70–99)
Potassium: 4.8 mmol/L (ref 3.5–5.1)
Sodium: 145 mmol/L (ref 135–145)

## 2021-11-11 LAB — MAGNESIUM: Magnesium: 2.2 mg/dL (ref 1.7–2.4)

## 2021-11-11 NOTE — Progress Notes (Signed)
Pulmonary [place]   PULMONARY CRITICAL CARE SERVICE  PROGRESS NOTE     Lori Kemp  [id number]  DOB: 06-20-1956   DOA: 10/28/2021  Referring Physician: Satira Sark, MD  HPI: Lori Kemp is a 66 y.o. female being followed for ventilator/airway/oxygen weaning Acute on Chronic Respiratory Failure.  Patient is on the ventilator and full support was attempted at weaning failed once again spoke with the primary team to try to back down on some of the narcotics  Medications: Reviewed on Rounds  Physical Exam:  Vitals: Temperature is 97.3 pulse 95 respiratory 17 blood pressure is 142/79 saturations 99%  Ventilator Settings on assist control FiO2 is 28% tidal volume 350 PEEP 5  General: Comfortable at this time Neck: supple Cardiovascular: no malignant arrhythmias Respiratory: Scattered rhonchi expansion is equal Skin: no rash seen on limited exam Musculoskeletal: No gross abnormality Psychiatric:unable to assess Neurologic:no involuntary movements         Lab Data:   Basic Metabolic Panel: Recent Labs  Lab 11/05/21 0334 11/05/21 0910 11/07/21 0652 11/08/21 0408 11/11/21 0355  NA 142  --  146*  --  145  K 5.3* 5.0 5.2* 4.6 4.8  CL 100  --  101  --  100  CO2 36*  --  40*  --  36*  GLUCOSE 129*  --  114*  --  120*  BUN 32*  --  55*  --  78*  CREATININE 0.52  --  0.79  --  1.03*  CALCIUM 9.2  --  9.8  --  9.4  MG 2.0  --  2.1  --  2.2    ABG: Recent Labs  Lab 11/06/21 0925 11/06/21 1814 11/07/21 0518 11/10/21 1147  PHART 7.145* 7.380 7.369 7.390  PCO2ART >120.0* 66.7* 68.4* 64.0*  PO2ART 114* 76.7* 112* 90.1  HCO3 41.1* 39.1* 38.4* 37.9*  O2SAT 97.8 97.3 98.7 96.9    Liver Function Tests: No results for input(s): AST, ALT, ALKPHOS, BILITOT, PROT, ALBUMIN in the last 168 hours. No results for input(s): LIPASE, AMYLASE in the last 168 hours. No results for input(s): AMMONIA in the last 168  hours.  CBC: Recent Labs  Lab 11/05/21 0334 11/07/21 0652 11/11/21 0355  WBC 4.9 6.5 5.9  HGB 10.3* 9.2* 8.9*  HCT 34.1* 30.9* 29.1*  MCV 100.6* 99.7 99.0  PLT 172 157 149*    Cardiac Enzymes: No results for input(s): CKTOTAL, CKMB, CKMBINDEX, TROPONINI in the last 168 hours.  BNP (last 3 results) No results for input(s): BNP in the last 8760 hours.  ProBNP (last 3 results) No results for input(s): PROBNP in the last 8760 hours.  Radiological Exams: MR NECK SOFT TISSUE ONLY W WO CONTRAST  Result Date: 11/10/2021 CLINICAL DATA:  Vertebral fusion, history of vertebral artery dissection EXAM: MRI OF THE NECK WITH CONTRAST TECHNIQUE: Multiplanar, multisequence MR imaging was performed following the administration of intravenous contrast. CONTRAST:  45mL GADAVIST GADOBUTROL 1 MMOL/ML IV SOLN COMPARISON:  No prior MRI of the neck FINDINGS: Evaluation is significantly limited by susceptibility artifact from the patient's cervical fusion hardware. The images obtained reflect the best efforts of the technologist. The post-contrast sequences are particularly limited by artifact. Pharynx and larynx: Status post tracheostomy. Mucosal edema in the nasopharynx and right-greater-than-left oropharynx. No definite mass. Salivary glands: Poorly visualized. Thyroid: Limited visualization; no definite abnormality. Lymph nodes: No definite lymphadenopathy. Vascular: Limited visualization demonstrates patent carotid artery flow voids. The vertebral arteries are only  identified in the posterior fossa, where the flow voids appear patent. 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 Otherwise negative. Visualized orbits: No acute abnormality. Mastoids and visualized paranasal sinuses: Mucosal thickening, most prominent in the ethmoid air cells and right sphenoid sinus. Fluid throughout the right mastoid air cells. A small amount of fluid in the left mastoid air cells. Skeleton: Unable  to evaluate the cervical spine and spinal canal secondary to susceptibility artifact from hardware. Upper chest: No pleural effusion. Other: Increased T2 signal, likely edema in the soft tissues of the right face and left neck. IMPRESSION: 1. Evaluation is significantly limited by susceptibility artifact from the patient's cervical fusion hardware, which particularly limits evaluation of the spine and extracranial vertebral arteries. The intracranial vertebral arteries are only visualized as flow voids on the T2 weighted sequence, but these flow voids appear patent in the V4 segments. 2. Mucosal edema in the nasopharynx and right-greater-than-left oropharynx. 3. Increased T2 signal, possibly edema, in the soft tissues of the right face and left neck. Correlate with physical exam. Electronically Signed   By: Merilyn Baba M.D.   On: 11/10/2021 23:44    Assessment/Plan Active Problems:   Acute on chronic respiratory failure with hypoxia (HCC)   Abscess in epidural space of cervical spine   Tracheostomy status (HCC)   COPD, severe (HCC)   Bilateral pleural effusion   Acute on chronic respiratory failure with hypoxia plan is going to be to continue with full support on the ventilator.  Try to back down on narcotics Epidural abscess supportive care no change Tracheostomy remains in place at this time Severe COPD medical management Pleural effusions status post drainage   I have personally seen and evaluated the patient, evaluated laboratory and imaging results, formulated the assessment and plan and placed orders. The Patient requires high complexity decision making with multiple systems involvement.  Rounds were done with the Respiratory Therapy Director and Staff therapists and discussed with nursing staff also.  Allyne Gee, MD [facility] Pulmonary Critical Care Medicine Sleep Medicine

## 2021-11-13 DIAGNOSIS — J9621 Acute and chronic respiratory failure with hypoxia: Secondary | ICD-10-CM | POA: Diagnosis not present

## 2021-11-13 DIAGNOSIS — J449 Chronic obstructive pulmonary disease, unspecified: Secondary | ICD-10-CM | POA: Diagnosis not present

## 2021-11-13 DIAGNOSIS — G061 Intraspinal abscess and granuloma: Secondary | ICD-10-CM | POA: Diagnosis not present

## 2021-11-13 DIAGNOSIS — J90 Pleural effusion, not elsewhere classified: Secondary | ICD-10-CM | POA: Diagnosis not present

## 2021-11-13 LAB — CULTURE, BODY FLUID W GRAM STAIN -BOTTLE: Culture: NO GROWTH

## 2021-11-13 NOTE — Progress Notes (Signed)
Pulmonary [place]   PULMONARY CRITICAL CARE SERVICE  PROGRESS NOTE     Lori Kemp  [id number]  DOB: Apr 12, 1956   DOA: 10/28/2021  Referring Physician: Satira Sark, MD  HPI: Lori Kemp is a 66 y.o. female being followed for ventilator/airway/oxygen weaning Acute on Chronic Respiratory Failure.  Patient's pressure support has been on 28% FiO2 with a pressure of 12/5  Medications: Reviewed on Rounds  Physical Exam:  Vitals: Temperature is 97.5 pulse 92 respiratory rate 12 blood pressure is 162/87 saturations 97%  Ventilator Settings on pressure support FiO2 is 28% pressure 12/5  General: Comfortable at this time Neck: supple Cardiovascular: no malignant arrhythmias Respiratory: Coarse rhonchi expansion is equal Skin: no rash seen on limited exam Musculoskeletal: No gross abnormality Psychiatric:unable to assess Neurologic:no involuntary movements         Lab Data:   Basic Metabolic Panel: Recent Labs  Lab 11/07/21 0652 11/08/21 0408 11/11/21 0355  NA 146*  --  145  K 5.2* 4.6 4.8  CL 101  --  100  CO2 40*  --  36*  GLUCOSE 114*  --  120*  BUN 55*  --  78*  CREATININE 0.79  --  1.03*  CALCIUM 9.8  --  9.4  MG 2.1  --  2.2    ABG: Recent Labs  Lab 11/06/21 1814 11/07/21 0518 11/10/21 1147  PHART 7.380 7.369 7.390  PCO2ART 66.7* 68.4* 64.0*  PO2ART 76.7* 112* 90.1  HCO3 39.1* 38.4* 37.9*  O2SAT 97.3 98.7 96.9    Liver Function Tests: No results for input(s): AST, ALT, ALKPHOS, BILITOT, PROT, ALBUMIN in the last 168 hours. No results for input(s): LIPASE, AMYLASE in the last 168 hours. No results for input(s): AMMONIA in the last 168 hours.  CBC: Recent Labs  Lab 11/07/21 0652 11/11/21 0355  WBC 6.5 5.9  HGB 9.2* 8.9*  HCT 30.9* 29.1*  MCV 99.7 99.0  PLT 157 149*    Cardiac Enzymes: No results for input(s): CKTOTAL, CKMB, CKMBINDEX, TROPONINI in the last 168 hours.  BNP (last 3  results) No results for input(s): BNP in the last 8760 hours.  ProBNP (last 3 results) No results for input(s): PROBNP in the last 8760 hours.  Radiological Exams: No results found.  Assessment/Plan Active Problems:   Acute on chronic respiratory failure with hypoxia (HCC)   Abscess in epidural space of cervical spine   Tracheostomy status (HCC)   COPD, severe (HCC)   Bilateral pleural effusion   Acute on chronic respiratory failure hypoxia patient is on weaning 8 hours today Epidural abscess supportive care we will continue to monitor Tracheostomy remains in place Severe COPD medical management Bilateral pleural effusion no change we will continue to follow along closely   I have personally seen and evaluated the patient, evaluated laboratory and imaging results, formulated the assessment and plan and placed orders. The Patient requires high complexity decision making with multiple systems involvement.  Rounds were done with the Respiratory Therapy Director and Staff therapists and discussed with nursing staff also.  Allyne Gee, MD [facility] Pulmonary Critical Care Medicine Sleep Medicine

## 2021-11-14 DIAGNOSIS — G061 Intraspinal abscess and granuloma: Secondary | ICD-10-CM | POA: Diagnosis not present

## 2021-11-14 DIAGNOSIS — J90 Pleural effusion, not elsewhere classified: Secondary | ICD-10-CM | POA: Diagnosis not present

## 2021-11-14 DIAGNOSIS — J449 Chronic obstructive pulmonary disease, unspecified: Secondary | ICD-10-CM | POA: Diagnosis not present

## 2021-11-14 DIAGNOSIS — J9621 Acute and chronic respiratory failure with hypoxia: Secondary | ICD-10-CM | POA: Diagnosis not present

## 2021-11-14 LAB — BASIC METABOLIC PANEL
Anion gap: 8 (ref 5–15)
BUN: 67 mg/dL — ABNORMAL HIGH (ref 8–23)
CO2: 38 mmol/L — ABNORMAL HIGH (ref 22–32)
Calcium: 8.9 mg/dL (ref 8.9–10.3)
Chloride: 96 mmol/L — ABNORMAL LOW (ref 98–111)
Creatinine, Ser: 0.81 mg/dL (ref 0.44–1.00)
GFR, Estimated: >60 mL/min (ref >60)
Glucose, Bld: 141 mg/dL — ABNORMAL HIGH (ref 70–99)
Potassium: 4.9 mmol/L (ref 3.5–5.1)
Sodium: 142 mmol/L (ref 135–145)

## 2021-11-14 NOTE — Progress Notes (Signed)
Pulmonary Critical Care Medicine [facility] [place]   PULMONARY CRITICAL CARE SERVICE  PROGRESS NOTE     Lori Kemp  [id number]:[id number]  DOB: 06-Jul-1956   DOA: 10/28/2021  Referring Physician: Luna Kitchens, MD  HPI: Lori Kemp is a 66 y.o. female being followed for ventilator/airway/oxygen weaning Acute on Chronic Respiratory Failure.  Patient is on pressure support patient is resting comfortably without distress has been on 28% FiO2 with goal of 12 hours  Medications: Reviewed on Rounds  Physical Exam:  Vitals: Temperature is 97.6 pulse 89 respiratory rate is 12 blood pressure is 168/52 saturations 96%  Ventilator Settings on pressure support FiO2 is 28% pressure 12/5  General: Comfortable at this time Neck: supple Cardiovascular: no malignant arrhythmias Respiratory: Scattered rhonchi expansion is equal Skin: no rash seen on limited exam Musculoskeletal: No gross abnormality Psychiatric:unable to assess Neurologic:no involuntary movements         Lab Data:   Basic Metabolic Panel: Recent Labs  Lab 11/08/21 0408 11/11/21 0355 11/14/21 0754  NA  --  145 142  K 4.6 4.8 4.9  CL  --  100 96*  CO2  --  36* 38*  GLUCOSE  --  120* 141*  BUN  --  78* 67*  CREATININE  --  1.03* 0.81  CALCIUM  --  9.4 8.9  MG  --  2.2  --     ABG: Recent Labs  Lab 11/10/21 1147  PHART 7.390  PCO2ART 64.0*  PO2ART 90.1  HCO3 37.9*  O2SAT 96.9    Liver Function Tests: No results for input(s): AST, ALT, ALKPHOS, BILITOT, PROT, ALBUMIN in the last 168 hours. No results for input(s): LIPASE, AMYLASE in the last 168 hours. No results for input(s): AMMONIA in the last 168 hours.  CBC: Recent Labs  Lab 11/11/21 0355  WBC 5.9  HGB 8.9*  HCT 29.1*  MCV 99.0  PLT 149*    Cardiac Enzymes: No results for input(s): CKTOTAL, CKMB, CKMBINDEX, TROPONINI in the last 168 hours.  BNP (last 3 results) No results for input(s): BNP in the last 8760  hours.  ProBNP (last 3 results) No results for input(s): PROBNP in the last 8760 hours.  Radiological Exams: No results found.  Assessment/Plan Active Problems:   Acute on chronic respiratory failure with hypoxia (HCC)   Abscess in epidural space of cervical spine   Tracheostomy status (HCC)   COPD, severe (HCC)   Bilateral pleural effusion   Acute on chronic respiratory failure hypoxia we will continue to wean on pressure support still little bit better able to go for 12 hours we will continue to advance Epidural abscess supportive care has been on antibiotics Tracheostomy will remain in place for now Bilateral effusion status post drainage Severe COPD medical management   I have personally seen and evaluated the patient, evaluated laboratory and imaging results, formulated the assessment and plan and placed orders. The Patient requires high complexity decision making with multiple systems involvement.  Rounds were done with the Respiratory Therapy Director and Staff therapists and discussed with nursing staff also.  Yevonne Pax, MD [facility] Pulmonary Critical Care Medicine Sleep Medicine

## 2021-11-15 DIAGNOSIS — G061 Intraspinal abscess and granuloma: Secondary | ICD-10-CM | POA: Diagnosis not present

## 2021-11-15 DIAGNOSIS — J449 Chronic obstructive pulmonary disease, unspecified: Secondary | ICD-10-CM | POA: Diagnosis not present

## 2021-11-15 DIAGNOSIS — J90 Pleural effusion, not elsewhere classified: Secondary | ICD-10-CM | POA: Diagnosis not present

## 2021-11-15 DIAGNOSIS — J9621 Acute and chronic respiratory failure with hypoxia: Secondary | ICD-10-CM | POA: Diagnosis not present

## 2021-11-15 NOTE — Progress Notes (Signed)
Pulmonary [place]   PULMONARY CRITICAL CARE SERVICE  PROGRESS NOTE     Lori Kemp  [id number]  DOB: 10/14/1955   DOA: 10/28/2021  Referring Physician: Satira Sark, MD  HPI: Lori Kemp is a 66 y.o. female being followed for ventilator/airway/oxygen weaning Acute on Chronic Respiratory Failure.  Patient is comfortable without distress has been afebrile remains on pressure support the goal is for 16 hours  Medications: Reviewed on Rounds  Physical Exam:  Vitals: Temperature 97.7 pulse 101 respiratory rate is 19 blood pressure is 138/83 saturations 97%  Ventilator Settings on pressure support FiO2 is 28% pressure 12/5  General: Comfortable at this time Neck: supple Cardiovascular: no malignant arrhythmias Respiratory: Scattered rhonchi expansion is equal Skin: no rash seen on limited exam Musculoskeletal: No gross abnormality Psychiatric:unable to assess Neurologic:no involuntary movements         Lab Data:   Basic Metabolic Panel: Recent Labs  Lab 11/11/21 0355 11/14/21 0754  NA 145 142  K 4.8 4.9  CL 100 96*  CO2 36* 38*  GLUCOSE 120* 141*  BUN 78* 67*  CREATININE 1.03* 0.81  CALCIUM 9.4 8.9  MG 2.2  --     ABG: Recent Labs  Lab 11/10/21 1147  PHART 7.390  PCO2ART 64.0*  PO2ART 90.1  HCO3 37.9*  O2SAT 96.9    Liver Function Tests: No results for input(s): AST, ALT, ALKPHOS, BILITOT, PROT, ALBUMIN in the last 168 hours. No results for input(s): LIPASE, AMYLASE in the last 168 hours. No results for input(s): AMMONIA in the last 168 hours.  CBC: Recent Labs  Lab 11/11/21 0355  WBC 5.9  HGB 8.9*  HCT 29.1*  MCV 99.0  PLT 149*    Cardiac Enzymes: No results for input(s): CKTOTAL, CKMB, CKMBINDEX, TROPONINI in the last 168 hours.  BNP (last 3 results) No results for input(s): BNP in the last 8760 hours.  ProBNP (last 3 results) No results for input(s): PROBNP in the last 8760  hours.  Radiological Exams: No results found.  Assessment/Plan Active Problems:   Acute on chronic respiratory failure with hypoxia (HCC)   Abscess in epidural space of cervical spine   Tracheostomy status (HCC)   COPD, severe (HCC)   Bilateral pleural effusion   Acute on chronic respiratory failure hypoxia we will continue with the wean on pressure support goal is 16 hours Epidural abscess supportive care therapy as tolerated Severe COPD medical management Tracheostomy remains in place Bilateral effusion status post drainage   I have personally seen and evaluated the patient, evaluated laboratory and imaging results, formulated the assessment and plan and placed orders. The Patient requires high complexity decision making with multiple systems involvement.  Rounds were done with the Respiratory Therapy Director and Staff therapists and discussed with nursing staff also.  Allyne Gee, MD [facility] Pulmonary Critical Care Medicine Sleep Medicine

## 2021-11-16 DIAGNOSIS — G061 Intraspinal abscess and granuloma: Secondary | ICD-10-CM | POA: Diagnosis not present

## 2021-11-16 DIAGNOSIS — J449 Chronic obstructive pulmonary disease, unspecified: Secondary | ICD-10-CM | POA: Diagnosis not present

## 2021-11-16 DIAGNOSIS — J9621 Acute and chronic respiratory failure with hypoxia: Secondary | ICD-10-CM | POA: Diagnosis not present

## 2021-11-16 DIAGNOSIS — J90 Pleural effusion, not elsewhere classified: Secondary | ICD-10-CM | POA: Diagnosis not present

## 2021-11-16 LAB — BLOOD GAS, ARTERIAL
Acid-Base Excess: 14.3 mmol/L — ABNORMAL HIGH (ref 0.0–2.0)
Bicarbonate: 40.5 mmol/L — ABNORMAL HIGH (ref 20.0–28.0)
FIO2: 28
O2 Saturation: 97.7 %
Patient temperature: 36.9
pCO2 arterial: 75 mmHg (ref 32.0–48.0)
pH, Arterial: 7.351 (ref 7.350–7.450)
pO2, Arterial: 94.8 mmHg (ref 83.0–108.0)

## 2021-11-16 NOTE — Progress Notes (Signed)
Pulmonary Critical Care Medicine [facility] [place]   PULMONARY CRITICAL CARE SERVICE  PROGRESS NOTE     Lori Kemp  FGH:[id number]  DOB: 09-Aug-1956   DOA: 10/28/2021  Referring Physician: Luna Kitchens, MD  HPI: Lori Kemp is a 66 y.o. female being followed for ventilator/airway/oxygen weaning Acute on Chronic Respiratory Failure.  Patient is on pressure support has been comfortable without distress and is supposed to do 9 hours of pressure support  Medications: Reviewed on Rounds  Physical Exam:  Vitals: Temperature is 97.4 pulse 96 respiratory rate is 14 blood pressure 135/72 saturations 97%  Ventilator Settings on pressure support goal of 9 hours  General: Comfortable at this time Neck: supple Cardiovascular: no malignant arrhythmias Respiratory: Scattered rhonchi expansion is equal Skin: no rash seen on limited exam Musculoskeletal: No gross abnormality Psychiatric:unable to assess Neurologic:no involuntary movements         Lab Data:   Basic Metabolic Panel: Recent Labs  Lab 11/11/21 0355 11/14/21 0754  NA 145 142  K 4.8 4.9  CL 100 96*  CO2 36* 38*  GLUCOSE 120* 141*  BUN 78* 67*  CREATININE 1.03* 0.81  CALCIUM 9.4 8.9  MG 2.2  --     ABG: Recent Labs  Lab 11/10/21 1147  PHART 7.390  PCO2ART 64.0*  PO2ART 90.1  HCO3 37.9*  O2SAT 96.9    Liver Function Tests: No results for input(s): AST, ALT, ALKPHOS, BILITOT, PROT, ALBUMIN in the last 168 hours. No results for input(s): LIPASE, AMYLASE in the last 168 hours. No results for input(s): AMMONIA in the last 168 hours.  CBC: Recent Labs  Lab 11/11/21 0355  WBC 5.9  HGB 8.9*  HCT 29.1*  MCV 99.0  PLT 149*    Cardiac Enzymes: No results for input(s): CKTOTAL, CKMB, CKMBINDEX, TROPONINI in the last 168 hours.  BNP (last 3 results) No results for input(s): BNP in the last 8760 hours.  ProBNP (last 3 results) No results for input(s): PROBNP in the last 8760  hours.  Radiological Exams: No results found.  Assessment/Plan Active Problems:   Acute on chronic respiratory failure with hypoxia (HCC)   Abscess in epidural space of cervical spine   Tracheostomy status (HCC)   COPD, severe (HCC)   Bilateral pleural effusion   Acute on chronic respiratory failure hypoxia plan is to continue to try to wean on pressure support we will get ABG today Severe COPD medical management we will continue to follow along closely Epidural abscess no change supportive care Tracheostomy remains in place Bilateral effusions monitoring x-rays   I have personally seen and evaluated the patient, evaluated laboratory and imaging results, formulated the assessment and plan and placed orders. The Patient requires high complexity decision making with multiple systems involvement.  Rounds were done with the Respiratory Therapy Director and Staff therapists and discussed with nursing staff also.  Yevonne Pax, MD [facility] Pulmonary Critical Care Medicine Sleep Medicine

## 2021-11-17 DIAGNOSIS — G061 Intraspinal abscess and granuloma: Secondary | ICD-10-CM | POA: Diagnosis not present

## 2021-11-17 DIAGNOSIS — J449 Chronic obstructive pulmonary disease, unspecified: Secondary | ICD-10-CM | POA: Diagnosis not present

## 2021-11-17 DIAGNOSIS — J9621 Acute and chronic respiratory failure with hypoxia: Secondary | ICD-10-CM | POA: Diagnosis not present

## 2021-11-17 DIAGNOSIS — J90 Pleural effusion, not elsewhere classified: Secondary | ICD-10-CM | POA: Diagnosis not present

## 2021-11-17 NOTE — Progress Notes (Signed)
Pulmonary Critical Care Medicine [facility] [place]   PULMONARY CRITICAL CARE SERVICE  PROGRESS NOTE     Lori Kemp  OFB:[id number]  DOB: 1956-01-28   DOA: 10/28/2021  Referring Physician: Luna Kitchens, MD  HPI: Lori Kemp is a 66 y.o. female being followed for ventilator/airway/oxygen weaning Acute on Chronic Respiratory Failure.  Patient is afebrile without distress has been on pressure support the goal is for 16 hours again  Medications: Reviewed on Rounds  Physical Exam:  Vitals: Temperature is 97.0 pulse 76 respiratory rate is 13 blood pressure 152/70 saturations 100%  Ventilator Settings pressure support FiO2 28% tidal volume 327 pressure 12/5  General: Comfortable at this time Neck: supple Cardiovascular: no malignant arrhythmias Respiratory: Scattered rhonchi very coarse breath sounds Skin: no rash seen on limited exam Musculoskeletal: No gross abnormality Psychiatric:unable to assess Neurologic:no involuntary movements         Lab Data:   Basic Metabolic Panel: Recent Labs  Lab 11/11/21 0355 11/14/21 0754  NA 145 142  K 4.8 4.9  CL 100 96*  CO2 36* 38*  GLUCOSE 120* 141*  BUN 78* 67*  CREATININE 1.03* 0.81  CALCIUM 9.4 8.9  MG 2.2  --     ABG: Recent Labs  Lab 11/10/21 1147 11/16/21 1010  PHART 7.390 7.351  PCO2ART 64.0* 75.0*  PO2ART 90.1 94.8  HCO3 37.9* 40.5*  O2SAT 96.9 97.7    Liver Function Tests: No results for input(s): AST, ALT, ALKPHOS, BILITOT, PROT, ALBUMIN in the last 168 hours. No results for input(s): LIPASE, AMYLASE in the last 168 hours. No results for input(s): AMMONIA in the last 168 hours.  CBC: Recent Labs  Lab 11/11/21 0355  WBC 5.9  HGB 8.9*  HCT 29.1*  MCV 99.0  PLT 149*    Cardiac Enzymes: No results for input(s): CKTOTAL, CKMB, CKMBINDEX, TROPONINI in the last 168 hours.  BNP (last 3 results) No results for input(s): BNP in the last 8760 hours.  ProBNP (last 3 results) No  results for input(s): PROBNP in the last 8760 hours.  Radiological Exams: No results found.  Assessment/Plan Active Problems:   Acute on chronic respiratory failure with hypoxia (HCC)   Abscess in epidural space of cervical spine   Tracheostomy status (HCC)   COPD, severe (HCC)   Bilateral pleural effusion   Acute on chronic respiratory failure hypoxia we will continue with the pressure support the goal is for 16 hours again patient was not able to tolerate 16 hours yesterday Epidural abscess no change patient has been treated with antibiotics Tracheostomy remains in place Severe COPD medical management Bilateral pleural effusions no change we will continue to follow along closely   I have personally seen and evaluated the patient, evaluated laboratory and imaging results, formulated the assessment and plan and placed orders. The Patient requires high complexity decision making with multiple systems involvement.  Rounds were done with the Respiratory Therapy Director and Staff therapists and discussed with nursing staff also.  Yevonne Pax, MD [facility] Pulmonary Critical Care Medicine Sleep Medicine

## 2021-11-18 DIAGNOSIS — G061 Intraspinal abscess and granuloma: Secondary | ICD-10-CM | POA: Diagnosis not present

## 2021-11-18 DIAGNOSIS — J449 Chronic obstructive pulmonary disease, unspecified: Secondary | ICD-10-CM | POA: Diagnosis not present

## 2021-11-18 DIAGNOSIS — J9621 Acute and chronic respiratory failure with hypoxia: Secondary | ICD-10-CM | POA: Diagnosis not present

## 2021-11-18 DIAGNOSIS — J90 Pleural effusion, not elsewhere classified: Secondary | ICD-10-CM | POA: Diagnosis not present

## 2021-11-18 LAB — CBC
HCT: 29.3 % — ABNORMAL LOW (ref 36.0–46.0)
Hemoglobin: 8.8 g/dL — ABNORMAL LOW (ref 12.0–15.0)
MCH: 29.4 pg (ref 26.0–34.0)
MCHC: 30 g/dL (ref 30.0–36.0)
MCV: 98 fL (ref 80.0–100.0)
Platelets: 185 10*3/uL (ref 150–400)
RBC: 2.99 MIL/uL — ABNORMAL LOW (ref 3.87–5.11)
RDW: 14.8 % (ref 11.5–15.5)
WBC: 5 10*3/uL (ref 4.0–10.5)
nRBC: 0 % (ref 0.0–0.2)

## 2021-11-18 LAB — BASIC METABOLIC PANEL
Anion gap: 8 (ref 5–15)
BUN: 63 mg/dL — ABNORMAL HIGH (ref 8–23)
CO2: 38 mmol/L — ABNORMAL HIGH (ref 22–32)
Calcium: 9.3 mg/dL (ref 8.9–10.3)
Chloride: 97 mmol/L — ABNORMAL LOW (ref 98–111)
Creatinine, Ser: 0.73 mg/dL (ref 0.44–1.00)
GFR, Estimated: >60 mL/min (ref >60)
Glucose, Bld: 115 mg/dL — ABNORMAL HIGH (ref 70–99)
Potassium: 5.5 mmol/L — ABNORMAL HIGH (ref 3.5–5.1)
Sodium: 143 mmol/L (ref 135–145)

## 2021-11-18 NOTE — Progress Notes (Signed)
[name]   PULMONARY CRITICAL CARE SERVICE  PROGRESS NOTE     Lori Kemp  [id number]  DOB: 08/25/1956   DOA: 10/28/2021  Referring Physician: Satira Sark, MD  HPI: Lori Kemp is a 66 y.o. female being followed for ventilator/airway/oxygen weaning Acute on Chronic Respiratory Failure.  Patient was able to do about 14 hours of pressure support  Medications: Reviewed on Rounds  Physical Exam:  Vitals: Temperature is 97.5 pulse 92 respiratory is 14 blood pressure is 128/70 saturations 100%  Ventilator Settings pressure support pressure 12/5  General: Comfortable at this time Neck: supple Cardiovascular: no malignant arrhythmias Respiratory: Scattered rhonchi expansion is equal Skin: no rash seen on limited exam Musculoskeletal: No gross abnormality Psychiatric:unable to assess Neurologic:no involuntary movements         Lab Data:   Basic Metabolic Panel: Recent Labs  Lab 11/14/21 0754 11/18/21 0432  NA 142 143  K 4.9 5.5*  CL 96* 97*  CO2 38* 38*  GLUCOSE 141* 115*  BUN 67* 63*  CREATININE 0.81 0.73  CALCIUM 8.9 9.3    ABG: Recent Labs  Lab 11/16/21 1010  PHART 7.351  PCO2ART 75.0*  PO2ART 94.8  HCO3 40.5*  O2SAT 97.7    Liver Function Tests: No results for input(s): AST, ALT, ALKPHOS, BILITOT, PROT, ALBUMIN in the last 168 hours. No results for input(s): LIPASE, AMYLASE in the last 168 hours. No results for input(s): AMMONIA in the last 168 hours.  CBC: Recent Labs  Lab 11/18/21 0432  WBC 5.0  HGB 8.8*  HCT 29.3*  MCV 98.0  PLT 185    Cardiac Enzymes: No results for input(s): CKTOTAL, CKMB, CKMBINDEX, TROPONINI in the last 168 hours.  BNP (last 3 results) No results for input(s): BNP in the last 8760 hours.  ProBNP (last 3 results) No results for input(s): PROBNP in the last 8760 hours.  Radiological Exams: No results found.  Assessment/Plan Active Problems:    Acute on chronic respiratory failure with hypoxia (HCC)   Abscess in epidural space of cervical spine   Tracheostomy status (HCC)   COPD, severe (HCC)   Bilateral pleural effusion   Acute on chronic respiratory failure with hypoxia we will try to wean for 16 hours if patient is able to tolerate Epidural abscess supportive care has been treated Tracheostomy remains in place for now Severe COPD medical management Bilateral effusions treated   I have personally seen and evaluated the patient, evaluated laboratory and imaging results, formulated the assessment and plan and placed orders. The Patient requires high complexity decision making with multiple systems involvement.  Rounds were done with the Respiratory Therapy Director and Staff therapists and discussed with nursing staff also.  Allyne Gee, MD [facility] Pulmonary Critical Care Medicine Sleep Medicine

## 2021-11-19 DIAGNOSIS — J9621 Acute and chronic respiratory failure with hypoxia: Secondary | ICD-10-CM | POA: Diagnosis not present

## 2021-11-19 DIAGNOSIS — G061 Intraspinal abscess and granuloma: Secondary | ICD-10-CM | POA: Diagnosis not present

## 2021-11-19 DIAGNOSIS — J90 Pleural effusion, not elsewhere classified: Secondary | ICD-10-CM | POA: Diagnosis not present

## 2021-11-19 DIAGNOSIS — J449 Chronic obstructive pulmonary disease, unspecified: Secondary | ICD-10-CM | POA: Diagnosis not present

## 2021-11-19 LAB — CBC
HCT: 29.5 % — ABNORMAL LOW (ref 36.0–46.0)
Hemoglobin: 9.1 g/dL — ABNORMAL LOW (ref 12.0–15.0)
MCH: 30.2 pg (ref 26.0–34.0)
MCHC: 30.8 g/dL (ref 30.0–36.0)
MCV: 98 fL (ref 80.0–100.0)
Platelets: 187 10*3/uL (ref 150–400)
RBC: 3.01 MIL/uL — ABNORMAL LOW (ref 3.87–5.11)
RDW: 14.9 % (ref 11.5–15.5)
WBC: 5 10*3/uL (ref 4.0–10.5)
nRBC: 0 % (ref 0.0–0.2)

## 2021-11-19 LAB — BASIC METABOLIC PANEL
Anion gap: 7 (ref 5–15)
BUN: 60 mg/dL — ABNORMAL HIGH (ref 8–23)
CO2: 40 mmol/L — ABNORMAL HIGH (ref 22–32)
Calcium: 9.4 mg/dL (ref 8.9–10.3)
Chloride: 97 mmol/L — ABNORMAL LOW (ref 98–111)
Creatinine, Ser: 0.71 mg/dL (ref 0.44–1.00)
GFR, Estimated: >60 mL/min (ref >60)
Glucose, Bld: 138 mg/dL — ABNORMAL HIGH (ref 70–99)
Potassium: 5.1 mmol/L (ref 3.5–5.1)
Sodium: 144 mmol/L (ref 135–145)

## 2021-11-19 NOTE — Progress Notes (Signed)
Pulmonary Critical Care Medicine [facility] [place]   PULMONARY CRITICAL CARE SERVICE  PROGRESS NOTE     Lori Kemp  ATF:[id number]  DOB: 09/21/1956   DOA: 10/28/2021  Referring Physician: Luna Kitchens, MD  HPI: Lori Kemp is a 66 y.o. female being followed for ventilator/airway/oxygen weaning Acute on Chronic Respiratory Failure.  Patient is on pressure support has been on 35% FiO2 she is still somnolent this morning  Medications: Reviewed on Rounds  Physical Exam:  Vitals: Temperature is 97.6 pulse 84 respiratory 16 blood pressure is 127/69 saturations 100%  Ventilator Settings on pressure support FiO2 is 35% pressure 12/5 tidal volume 348  General: Comfortable at this time Neck: supple Cardiovascular: no malignant arrhythmias Respiratory: Scattered rhonchi expansion is equal Skin: no rash seen on limited exam Musculoskeletal: No gross abnormality Psychiatric:unable to assess Neurologic:no involuntary movements         Lab Data:   Basic Metabolic Panel: Recent Labs  Lab 11/14/21 0754 11/18/21 0432 11/19/21 0143  NA 142 143 144  K 4.9 5.5* 5.1  CL 96* 97* 97*  CO2 38* 38* 40*  GLUCOSE 141* 115* 138*  BUN 67* 63* 60*  CREATININE 0.81 0.73 0.71  CALCIUM 8.9 9.3 9.4    ABG: Recent Labs  Lab 11/16/21 1010  PHART 7.351  PCO2ART 75.0*  PO2ART 94.8  HCO3 40.5*  O2SAT 97.7    Liver Function Tests: No results for input(s): AST, ALT, ALKPHOS, BILITOT, PROT, ALBUMIN in the last 168 hours. No results for input(s): LIPASE, AMYLASE in the last 168 hours. No results for input(s): AMMONIA in the last 168 hours.  CBC: Recent Labs  Lab 11/18/21 0432 11/19/21 0143  WBC 5.0 5.0  HGB 8.8* 9.1*  HCT 29.3* 29.5*  MCV 98.0 98.0  PLT 185 187    Cardiac Enzymes: No results for input(s): CKTOTAL, CKMB, CKMBINDEX, TROPONINI in the last 168 hours.  BNP (last 3 results) No results for input(s): BNP in the last 8760 hours.  ProBNP (last 3  results) No results for input(s): PROBNP in the last 8760 hours.  Radiological Exams: No results found.  Assessment/Plan Active Problems:   Acute on chronic respiratory failure with hypoxia (HCC)   Abscess in epidural space of cervical spine   Tracheostomy status (HCC)   COPD, severe (HCC)   Bilateral pleural effusion   Acute on chronic respiratory failure hypoxia patient is going to continue with the weaning attempts however is still sleepy.  Yesterday patient had been doing well 14 hours of pressure support we will try to advance further Epidural abscess no change we will continue to monitor along closely. Tracheostomy will remain in place Severe COPD medical management Bilateral pleural effusion no change we will continue to follow along closely   I have personally seen and evaluated the patient, evaluated laboratory and imaging results, formulated the assessment and plan and placed orders. The Patient requires high complexity decision making with multiple systems involvement.  Rounds were done with the Respiratory Therapy Director and Staff therapists and discussed with nursing staff also.  Yevonne Pax, MD [facility] Pulmonary Critical Care Medicine Sleep Medicine

## 2021-11-20 LAB — BLOOD GAS, ARTERIAL
Acid-Base Excess: 15.8 mmol/L — ABNORMAL HIGH (ref 0.0–2.0)
Bicarbonate: 42.3 mmol/L — ABNORMAL HIGH (ref 20.0–28.0)
FIO2: 35
O2 Saturation: 96.7 %
Patient temperature: 36.6
pCO2 arterial: 79.4 mmHg (ref 32.0–48.0)
pH, Arterial: 7.343 — ABNORMAL LOW (ref 7.350–7.450)
pO2, Arterial: 85.2 mmHg (ref 83.0–108.0)

## 2021-11-20 LAB — POTASSIUM: Potassium: 5.2 mmol/L — ABNORMAL HIGH (ref 3.5–5.1)

## 2021-11-21 ENCOUNTER — Other Ambulatory Visit (HOSPITAL_COMMUNITY): Payer: Medicare Other

## 2021-11-21 DIAGNOSIS — J90 Pleural effusion, not elsewhere classified: Secondary | ICD-10-CM | POA: Diagnosis not present

## 2021-11-21 DIAGNOSIS — J449 Chronic obstructive pulmonary disease, unspecified: Secondary | ICD-10-CM | POA: Diagnosis not present

## 2021-11-21 DIAGNOSIS — G061 Intraspinal abscess and granuloma: Secondary | ICD-10-CM | POA: Diagnosis not present

## 2021-11-21 DIAGNOSIS — J9621 Acute and chronic respiratory failure with hypoxia: Secondary | ICD-10-CM | POA: Diagnosis not present

## 2021-11-21 LAB — COMPREHENSIVE METABOLIC PANEL
ALT: 22 U/L (ref 0–44)
AST: 21 U/L (ref 15–41)
Albumin: 1.8 g/dL — ABNORMAL LOW (ref 3.5–5.0)
Alkaline Phosphatase: 81 U/L (ref 38–126)
Anion gap: 7 (ref 5–15)
BUN: 58 mg/dL — ABNORMAL HIGH (ref 8–23)
CO2: 42 mmol/L — ABNORMAL HIGH (ref 22–32)
Calcium: 9.5 mg/dL (ref 8.9–10.3)
Chloride: 96 mmol/L — ABNORMAL LOW (ref 98–111)
Creatinine, Ser: 0.76 mg/dL (ref 0.44–1.00)
GFR, Estimated: >60 mL/min (ref >60)
Glucose, Bld: 136 mg/dL — ABNORMAL HIGH (ref 70–99)
Potassium: 5 mmol/L (ref 3.5–5.1)
Sodium: 145 mmol/L (ref 135–145)
Total Bilirubin: 0.2 mg/dL — ABNORMAL LOW (ref 0.3–1.2)
Total Protein: 5.9 g/dL — ABNORMAL LOW (ref 6.5–8.1)

## 2021-11-21 LAB — CBC
HCT: 31 % — ABNORMAL LOW (ref 36.0–46.0)
Hemoglobin: 9.3 g/dL — ABNORMAL LOW (ref 12.0–15.0)
MCH: 29.2 pg (ref 26.0–34.0)
MCHC: 30 g/dL (ref 30.0–36.0)
MCV: 97.5 fL (ref 80.0–100.0)
Platelets: 187 10*3/uL (ref 150–400)
RBC: 3.18 MIL/uL — ABNORMAL LOW (ref 3.87–5.11)
RDW: 14.8 % (ref 11.5–15.5)
WBC: 6.7 10*3/uL (ref 4.0–10.5)
nRBC: 0 % (ref 0.0–0.2)

## 2021-11-21 LAB — PHOSPHORUS: Phosphorus: 4.2 mg/dL (ref 2.5–4.6)

## 2021-11-21 LAB — MAGNESIUM: Magnesium: 1.9 mg/dL (ref 1.7–2.4)

## 2021-11-21 IMAGING — CT CT ANGIO HEAD-NECK (W OR W/O PERF)
2 of 11 series · 6 of 34 positions shown · non-contrast
Comparison: MRA neck [DATE]; no prior CTA is available for
comparison.
COMPARISON: MRA neck [DATE]; no prior CTA is available for
comparison.

Addendum:
CLINICAL DATA: History of vertebral artery dissection

EXAM:
CT ANGIOGRAPHY HEAD AND NECK
TECHNIQUE: Multidetector CT imaging of the head and neck was performed using
the standard protocol during bolus administration of intravenous
contrast. Multiplanar CT image reconstructions and MIPs were
obtained to evaluate the vascular anatomy. Carotid stenosis
measurements (when applicable) are obtained utilizing NASCET
criteria, using the distal internal carotid diameter as the
denominator.

[Series 8: sagittal · sagittal · 0.30mm/px · 1 of 53 slices shown]
[im 15/53  soft-tissue]
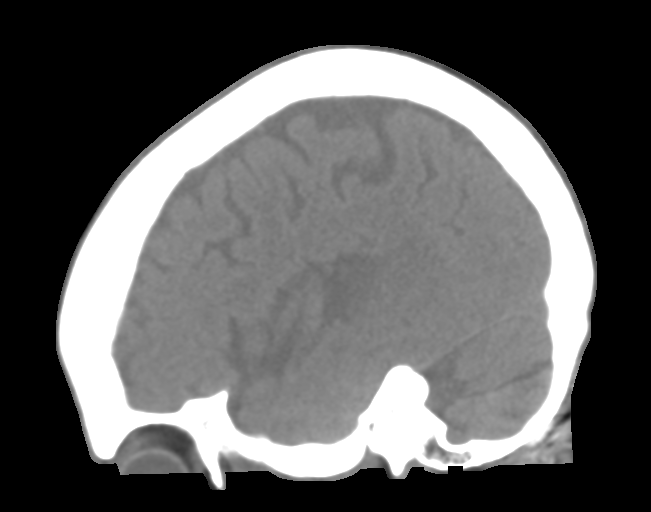

[Series 11: cta neck axial · axial · 0.39mm/px · z∈[-332,-104]mm · 5 of 346 slices shown]
[im 58/346  soft-tissue]
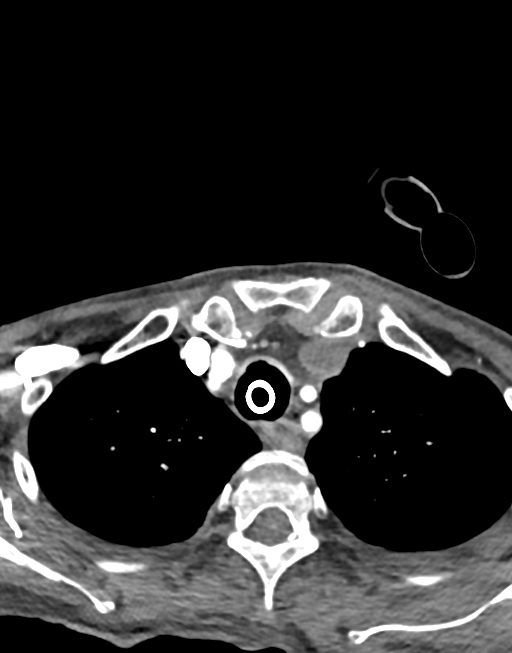
[im 116/346  bone]
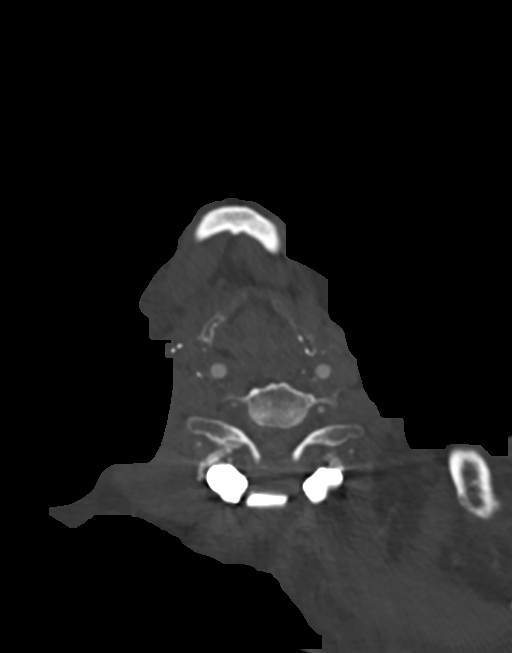
[im 173/346  soft-tissue]
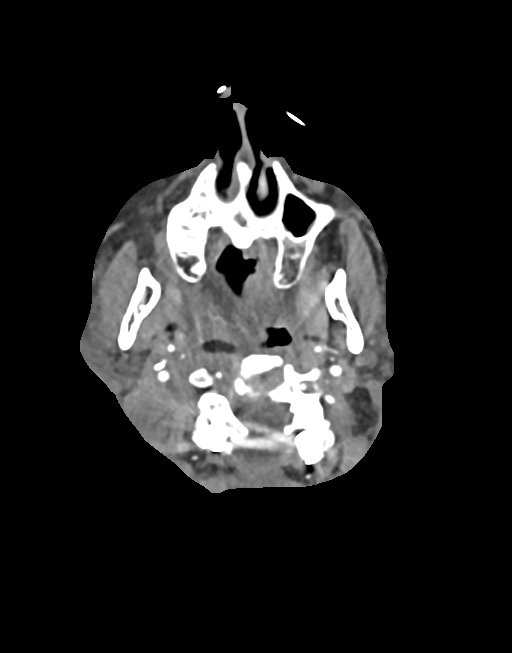
[im 231/346  bone]
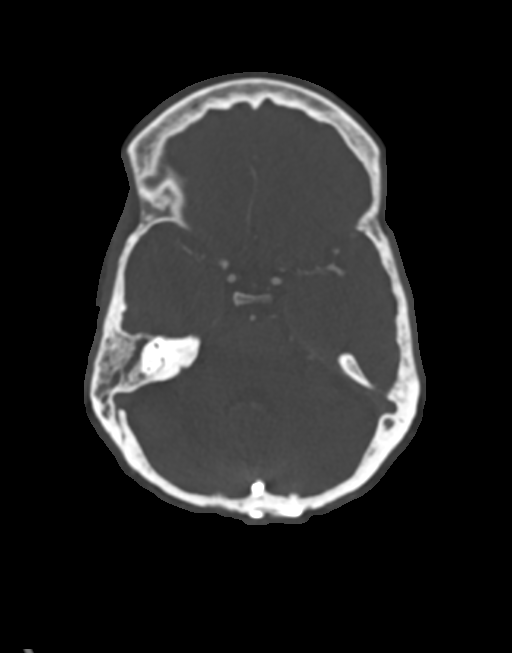
[im 288/346  soft-tissue]
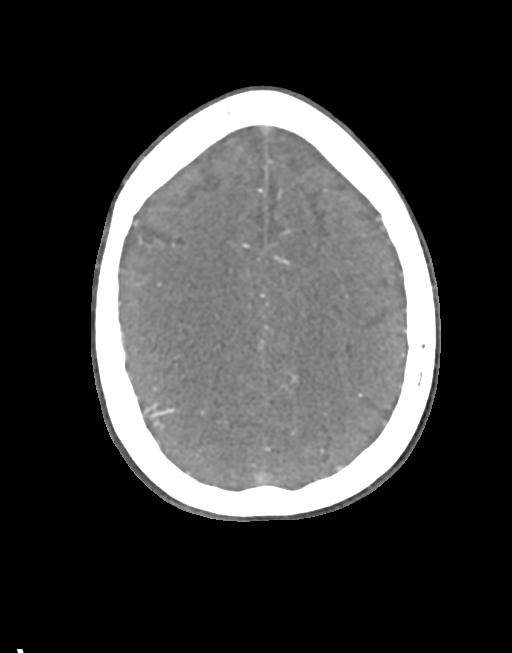

[6 of 34 positions shown; findings below may reference images not displayed]

RADIATION DOSE REDUCTION: This exam was performed according to the
departmental dose-optimization program which includes automated
exposure control, adjustment of the mA and/or kV according to
patient size and/or use of iterative reconstruction technique.

CONTRAST:  75mL OMNIPAQUE IOHEXOL 350 MG/ML SOLN
FINDINGS: CT HEAD FINDINGS

Brain: No evidence of acute infarction, hemorrhage, cerebral edema,
mass, mass effect, or midline shift. No hydrocephalus or extra-axial
fluid collection. Periventricular white matter changes, likely the
sequela of chronic small vessel ischemic disease.

Vascular: No hyperdense vessel.

Skull: Status post prior cervical and occipital fusion. Negative for
fracture or focal lesion.

Sinuses/Orbits: Mucosal thickening in the right sphenoid sinus and
right maxillary sinus. The orbits are unremarkable.

Other: The mastoid air cells are completely opacified bilaterally,
with fluid in the bilateral middle ears

CTA NECK FINDINGS

Aortic arch: Standard branching. Imaged portion shows no evidence of
aneurysm or dissection. No significant stenosis of the major arch
vessel origins. Aortic atherosclerosis, which extends into the
branch vessels, without hemodynamically significant stenosis.

Right carotid system: Irregularity of the right common carotid
artery, likely in part secondary to right neck dissection. The right
internal carotid artery is patent, without significant stenosis,
dissection, or occlusion.

Left carotid system: No evidence of dissection, stenosis (50% or
greater) or occlusion.

Vertebral arteries: Diminutive right V1, with occlusion of the V2
segment at the level C5 and reconstitution at the C2-C3 level. Focal
narrowing in the distal V2 and proximal V3 segment. The left
vertebral artery is patent from its origin to the level of C2-C3,
with distal reconstitution in the V3 segment. The vertebral arteries
appear similar to the [DATE] MRA, when accounting for
differences in technique.

Skeleton: Status post prior C2-C7 posterior decompression and
fusion. Possible malposition of the disc spacer at C3-C4, which
extends past the anterior margin of the vertebral bodies. Chronic
appearing fractures of C1 and C2, with basilar impression.

Other neck: Status post tracheostomy. Sequela of prior right neck
dissection no acute process in the neck.

Upper chest: Small bilateral pleural effusions with associated
atelectasis. Emphysema.

Review of the MIP images confirms the above findings

CTA HEAD FINDINGS

Anterior circulation: Both internal carotid arteries are patent to
the termini, without significant stenosis.

A1 segments patent. Normal anterior communicating artery. Anterior
cerebral arteries are patent to their distal aspects.

No M1 stenosis or occlusion. Normal MCA bifurcations. Distal MCA
branches perfused and symmetric.

Posterior circulation: Vertebral arteries patent to the
vertebrobasilar junction without stenosis. Posterior inferior
cerebral arteries patent bilaterally.

Basilar patent to its distal aspect. Superior cerebellar arteries
patent bilaterally.

Patent P1 segments. PCAs perfused to their distal aspects without
stenosis. The bilateral posterior communicating arteries are patent.

Venous sinuses: As permitted by contrast timing, patent.

Anatomic variants: None significant.

Review of the MIP images confirms the above findings
IMPRESSION: 1. Given differences in technique, the appearance of the bilateral
vertebral arteries is unchanged compared to the [DATE] MRA, with
occlusion of the right vertebral artery from the level of C5 through
C2-C3 and occlusion of the left vertebral artery from C2-C3 through
the V3 segment.
2. No other significant stenosis in the neck.
3. No intracranial large vessel occlusion or significant stenosis.
4. Status post prior C2-C7 posterior decompression and fusion. The
disc spacer at C3-C4 appears to extend past the anterior margin of
the vertebral bodies, of indeterminate acuity.
5. Small bilateral pleural effusions associated atelectasis.

ADDENDUM:
6. Fluid in the bilateral mastoid air cells and middle ears, which
can be seen in the setting of otomastoiditis or otitis media.
Correlate with symptoms.

*** End of Addendum ***
RADIATION DOSE REDUCTION: This exam was performed according to the
departmental dose-optimization program which includes automated
exposure control, adjustment of the mA and/or kV according to
patient size and/or use of iterative reconstruction technique.

CONTRAST:  75mL OMNIPAQUE IOHEXOL 350 MG/ML SOLN
FINDINGS: CT HEAD FINDINGS

Brain: No evidence of acute infarction, hemorrhage, cerebral edema,
mass, mass effect, or midline shift. No hydrocephalus or extra-axial
fluid collection. Periventricular white matter changes, likely the
sequela of chronic small vessel ischemic disease.

Vascular: No hyperdense vessel.

Skull: Status post prior cervical and occipital fusion. Negative for
fracture or focal lesion.

Sinuses/Orbits: Mucosal thickening in the right sphenoid sinus and
right maxillary sinus. The orbits are unremarkable.

Other: The mastoid air cells are completely opacified bilaterally,
with fluid in the bilateral middle ears

CTA NECK FINDINGS

Aortic arch: Standard branching. Imaged portion shows no evidence of
aneurysm or dissection. No significant stenosis of the major arch
vessel origins. Aortic atherosclerosis, which extends into the
branch vessels, without hemodynamically significant stenosis.

Right carotid system: Irregularity of the right common carotid
artery, likely in part secondary to right neck dissection. The right
internal carotid artery is patent, without significant stenosis,
dissection, or occlusion.

Left carotid system: No evidence of dissection, stenosis (50% or
greater) or occlusion.

Vertebral arteries: Diminutive right V1, with occlusion of the V2
segment at the level C5 and reconstitution at the C2-C3 level. Focal
narrowing in the distal V2 and proximal V3 segment. The left
vertebral artery is patent from its origin to the level of C2-C3,
with distal reconstitution in the V3 segment. The vertebral arteries
appear similar to the [DATE] MRA, when accounting for
differences in technique.

Skeleton: Status post prior C2-C7 posterior decompression and
fusion. Possible malposition of the disc spacer at C3-C4, which
extends past the anterior margin of the vertebral bodies. Chronic
appearing fractures of C1 and C2, with basilar impression.

Other neck: Status post tracheostomy. Sequela of prior right neck
dissection no acute process in the neck.

Upper chest: Small bilateral pleural effusions with associated
atelectasis. Emphysema.

Review of the MIP images confirms the above findings

CTA HEAD FINDINGS

Anterior circulation: Both internal carotid arteries are patent to
the termini, without significant stenosis.

A1 segments patent. Normal anterior communicating artery. Anterior
cerebral arteries are patent to their distal aspects.

No M1 stenosis or occlusion. Normal MCA bifurcations. Distal MCA
branches perfused and symmetric.

Posterior circulation: Vertebral arteries patent to the
vertebrobasilar junction without stenosis. Posterior inferior
cerebral arteries patent bilaterally.

Basilar patent to its distal aspect. Superior cerebellar arteries
patent bilaterally.

Patent P1 segments. PCAs perfused to their distal aspects without
stenosis. The bilateral posterior communicating arteries are patent.

Venous sinuses: As permitted by contrast timing, patent.

Anatomic variants: None significant.

Review of the MIP images confirms the above findings
IMPRESSION: 1. Given differences in technique, the appearance of the bilateral
vertebral arteries is unchanged compared to the [DATE] MRA, with
occlusion of the right vertebral artery from the level of C5 through
C2-C3 and occlusion of the left vertebral artery from C2-C3 through
the V3 segment.
2. No other significant stenosis in the neck.
3. No intracranial large vessel occlusion or significant stenosis.
4. Status post prior C2-C7 posterior decompression and fusion. The
disc spacer at C3-C4 appears to extend past the anterior margin of
the vertebral bodies, of indeterminate acuity.
5. Small bilateral pleural effusions associated atelectasis.

## 2021-11-21 MED ORDER — IOHEXOL 350 MG/ML SOLN
75.0000 mL | Freq: Once | INTRAVENOUS | Status: AC | PRN
  Administered 2021-11-21: 75 mL via INTRAVENOUS

## 2021-11-21 NOTE — Progress Notes (Signed)
Pulmonary Critical Care Medicine [facility] [place]   PULMONARY CRITICAL CARE SERVICE  PROGRESS NOTE     Lori Kemp  TXM:[id number]  DOB: 01-09-1956   DOA: 10/28/2021  Referring Physician: Luna Kitchens, MD  HPI: Lori Kemp is a 66 y.o. female being followed for ventilator/airway/oxygen weaning Acute on Chronic Respiratory Failure.  Patient is currently on assist control mode she is supposed to try T-piece with tried yesterday and did not tolerate  Medications: Reviewed on Rounds  Physical Exam:  Vitals: Temperature is 98.2 pulse 96 respiratory 20 blood pressure is 153/82 saturations 100%  Ventilator Settings on assist control FiO2 is 35% tidal line 371 PEEP 5  General: Comfortable at this time Neck: supple Cardiovascular: no malignant arrhythmias Respiratory: No rhonchi very coarse breath sounds Skin: no rash seen on limited exam Musculoskeletal: No gross abnormality Psychiatric:unable to assess Neurologic:no involuntary movements         Lab Data:   Basic Metabolic Panel: Recent Labs  Lab 11/18/21 0432 11/19/21 0143 11/20/21 0915 11/21/21 0346  NA 143 144  --  145  K 5.5* 5.1 5.2* 5.0  CL 97* 97*  --  96*  CO2 38* 40*  --  42*  GLUCOSE 115* 138*  --  136*  BUN 63* 60*  --  58*  CREATININE 0.73 0.71  --  0.76  CALCIUM 9.3 9.4  --  9.5  MG  --   --   --  1.9  PHOS  --   --   --  4.2    ABG: Recent Labs  Lab 11/16/21 1010 11/20/21 1055  PHART 7.351 7.343*  PCO2ART 75.0* 79.4*  PO2ART 94.8 85.2  HCO3 40.5* 42.3*  O2SAT 97.7 96.7    Liver Function Tests: Recent Labs  Lab 11/21/21 0346  AST 21  ALT 22  ALKPHOS 81  BILITOT 0.2*  PROT 5.9*  ALBUMIN 1.8*   No results for input(s): LIPASE, AMYLASE in the last 168 hours. No results for input(s): AMMONIA in the last 168 hours.  CBC: Recent Labs  Lab 11/18/21 0432 11/19/21 0143 11/21/21 0346  WBC 5.0 5.0 6.7  HGB 8.8* 9.1* 9.3*  HCT 29.3* 29.5* 31.0*  MCV 98.0 98.0  97.5  PLT 185 187 187    Cardiac Enzymes: No results for input(s): CKTOTAL, CKMB, CKMBINDEX, TROPONINI in the last 168 hours.  BNP (last 3 results) No results for input(s): BNP in the last 8760 hours.  ProBNP (last 3 results) No results for input(s): PROBNP in the last 8760 hours.  Radiological Exams: No results found.  Assessment/Plan Active Problems:   Acute on chronic respiratory failure with hypoxia (HCC)   Abscess in epidural space of cervical spine   Tracheostomy status (HCC)   COPD, severe (HCC)   Bilateral pleural effusion   Acute on chronic respiratory failure hypoxia plan is going to be to continue with the wean patient is supposed to be on the T collar.  We will continue secretion management pulmonary toilet. Epidural abscess no change Tracheostomy remains in place we will continue to follow along Severe COPD medical management Bilateral pleural effusions no change we will continue with supportive care   I have personally seen and evaluated the patient, evaluated laboratory and imaging results, formulated the assessment and plan and placed orders. The Patient requires high complexity decision making with multiple systems involvement.  Rounds were done with the Respiratory Therapy Director and Staff therapists and discussed with nursing staff also.  Lori Kemp A  Humphrey Rolls, MD [facility] Pulmonary Critical Care Medicine Sleep Medicine

## 2021-11-22 DIAGNOSIS — J9621 Acute and chronic respiratory failure with hypoxia: Secondary | ICD-10-CM | POA: Diagnosis not present

## 2021-11-22 DIAGNOSIS — G061 Intraspinal abscess and granuloma: Secondary | ICD-10-CM | POA: Diagnosis not present

## 2021-11-22 DIAGNOSIS — J90 Pleural effusion, not elsewhere classified: Secondary | ICD-10-CM | POA: Diagnosis not present

## 2021-11-22 DIAGNOSIS — J449 Chronic obstructive pulmonary disease, unspecified: Secondary | ICD-10-CM | POA: Diagnosis not present

## 2021-11-22 NOTE — Progress Notes (Signed)
Pulmonary [place]   PULMONARY CRITICAL CARE SERVICE  PROGRESS NOTE     Taylor Jerez  [id number]  DOB: [birth year]   DOA: 10/28/2021  Referring Physician: Satira Sark, MD  HPI: Lori Kemp is a 66 y.o. female being followed for ventilator/airway/oxygen weaning Acute on Chronic Respiratory Failure.  This morning patient was started on T-piece was on 35% FiO2 with a goal of 4 hours  Medications: Reviewed on Rounds  Physical Exam:  Vitals: Temperature is 98.8 pulse 89 respiratory rate is 23 blood pressure is 133/75 saturations 100%  Ventilator Settings on T-piece FiO2 is 35%  General: Comfortable at this time Neck: supple Cardiovascular: no malignant arrhythmias Respiratory: No rhonchi very coarse breath sounds Skin: no rash seen on limited exam Musculoskeletal: No gross abnormality Psychiatric:unable to assess Neurologic:no involuntary movements         Lab Data:   Basic Metabolic Panel: Recent Labs  Lab 11/18/21 0432 11/19/21 0143 11/20/21 0915 11/21/21 0346  NA 143 144  --  145  K 5.5* 5.1 5.2* 5.0  CL 97* 97*  --  96*  CO2 38* 40*  --  42*  GLUCOSE 115* 138*  --  136*  BUN 63* 60*  --  58*  CREATININE 0.73 0.71  --  0.76  CALCIUM 9.3 9.4  --  9.5  MG  --   --   --  1.9  PHOS  --   --   --  4.2    ABG: Recent Labs  Lab 11/16/21 1010 11/20/21 1055  PHART 7.351 7.343*  PCO2ART 75.0* 79.4*  PO2ART 94.8 85.2  HCO3 40.5* 42.3*  O2SAT 97.7 96.7    Liver Function Tests: Recent Labs  Lab 11/21/21 0346  AST 21  ALT 22  ALKPHOS 81  BILITOT 0.2*  PROT 5.9*  ALBUMIN 1.8*   No results for input(s): LIPASE, AMYLASE in the last 168 hours. No results for input(s): AMMONIA in the last 168 hours.  CBC: Recent Labs  Lab 11/18/21 0432 11/19/21 0143 11/21/21 0346  WBC 5.0 5.0 6.7  HGB 8.8* 9.1* 9.3*  HCT 29.3* 29.5* 31.0*  MCV 98.0 98.0 97.5  PLT 185 187 187    Cardiac Enzymes: No results  for input(s): CKTOTAL, CKMB, CKMBINDEX, TROPONINI in the last 168 hours.  BNP (last 3 results) No results for input(s): BNP in the last 8760 hours.  ProBNP (last 3 results) No results for input(s): PROBNP in the last 8760 hours.  Radiological Exams: CT ANGIO HEAD NECK W WO CM  Addendum Date: 11/21/2021   ADDENDUM REPORT: 11/21/2021 17:56 ADDENDUM: 6. Fluid in the bilateral mastoid air cells and middle ears, which can be seen in the setting of otomastoiditis or otitis media. Correlate with symptoms. Electronically Signed   By: Merilyn Baba M.D.   On: 11/21/2021 17:56   Result Date: 11/21/2021 CLINICAL DATA:  History of vertebral artery dissection EXAM: CT ANGIOGRAPHY HEAD AND NECK TECHNIQUE: Multidetector CT imaging of the head and neck was performed using the standard protocol during bolus administration of intravenous contrast. Multiplanar CT image reconstructions and MIPs were obtained to evaluate the vascular anatomy. Carotid stenosis measurements (when applicable) are obtained utilizing NASCET criteria, using the distal internal carotid diameter as the denominator. RADIATION DOSE REDUCTION: This exam was performed according to the departmental dose-optimization program which includes automated exposure control, adjustment of the mA and/or kV according to patient size and/or use of iterative reconstruction technique. CONTRAST:  43mL  OMNIPAQUE IOHEXOL 350 MG/ML SOLN COMPARISON:  MRA neck 11/08/2021; no prior CTA is available for comparison. FINDINGS: CT HEAD FINDINGS Brain: No evidence of acute infarction, hemorrhage, cerebral edema, mass, mass effect, or midline shift. No hydrocephalus or extra-axial fluid collection. Periventricular white matter changes, likely the sequela of chronic small vessel ischemic disease. Vascular: No hyperdense vessel. Skull: Status post prior cervical and occipital fusion. Negative for fracture or focal lesion. Sinuses/Orbits: Mucosal thickening in the right sphenoid  sinus and right maxillary sinus. The orbits are unremarkable. Other: The mastoid air cells are completely opacified bilaterally, with fluid in the bilateral middle ears CTA NECK FINDINGS Aortic arch: Standard branching. Imaged portion shows no evidence of aneurysm or dissection. No significant stenosis of the major arch vessel origins. Aortic atherosclerosis, which extends into the branch vessels, without hemodynamically significant stenosis. Right carotid system: Irregularity of the right common carotid artery, likely in part secondary to right neck dissection. The right internal carotid artery is patent, without significant stenosis, dissection, or occlusion. Left carotid system: No evidence of dissection, stenosis (50% or greater) or occlusion. Vertebral arteries: Diminutive right V1, with occlusion of the V2 segment at the level C5 and reconstitution at the C2-C3 level. Focal narrowing in the distal V2 and proximal V3 segment. The left vertebral artery is patent from its origin to the level of C2-C3, with distal reconstitution in the V3 segment. The vertebral arteries appear similar to the 11/08/2021 MRA, when accounting for differences in technique. Skeleton: Status post prior C2-C7 posterior decompression and fusion. Possible malposition of the disc spacer at C3-C4, which extends past the anterior margin of the vertebral bodies. Chronic appearing fractures of C1 and C2, with basilar impression. Other neck: Status post tracheostomy. Sequela of prior right neck dissection no acute process in the neck. Upper chest: Small bilateral pleural effusions with associated atelectasis. Emphysema. Review of the MIP images confirms the above findings CTA HEAD FINDINGS Anterior circulation: Both internal carotid arteries are patent to the termini, without significant stenosis. A1 segments patent. Normal anterior communicating artery. Anterior cerebral arteries are patent to their distal aspects. No M1 stenosis or occlusion.  Normal MCA bifurcations. Distal MCA branches perfused and symmetric. Posterior circulation: Vertebral arteries patent to the vertebrobasilar junction without stenosis. Posterior inferior cerebral arteries patent bilaterally. Basilar patent to its distal aspect. Superior cerebellar arteries patent bilaterally. Patent P1 segments. PCAs perfused to their distal aspects without stenosis. The bilateral posterior communicating arteries are patent. Venous sinuses: As permitted by contrast timing, patent. Anatomic variants: None significant. Review of the MIP images confirms the above findings IMPRESSION: 1. 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 2. No other significant stenosis in the neck. 3. No intracranial large vessel occlusion or significant stenosis. 4. Status post prior C2-C7 posterior decompression and fusion. The disc spacer at C3-C4 appears to extend past the anterior margin of the vertebral bodies, of indeterminate acuity. 5. Small bilateral pleural effusions associated atelectasis. Electronically Signed: By: Wiliam Ke M.D. On: 11/21/2021 17:50    Assessment/Plan Active Problems:   Acute on chronic respiratory failure with hypoxia (HCC)   Abscess in epidural space of cervical spine   Tracheostomy status (HCC)   COPD, severe (HCC)   Bilateral pleural effusion   Acute on chronic respiratory failure hypoxia plan  is going to be to continue with the T-piece wean goal of 4 hours we will continue to monitor along closely Epidural abscess has been treated Severe COPD medical management Tracheostomy remains in place Bilateral pleural effusion at baseline   I have personally seen and evaluated the patient, evaluated laboratory and imaging results, formulated  the assessment and plan and placed orders. The Patient requires high complexity decision making with multiple systems involvement.  Rounds were done with the Respiratory Therapy Director and Staff therapists and discussed with nursing staff also.  Allyne Gee, MD [facility] Pulmonary Critical Care Medicine Sleep Medicine

## 2021-11-23 DIAGNOSIS — J9621 Acute and chronic respiratory failure with hypoxia: Secondary | ICD-10-CM | POA: Diagnosis not present

## 2021-11-23 DIAGNOSIS — J449 Chronic obstructive pulmonary disease, unspecified: Secondary | ICD-10-CM | POA: Diagnosis not present

## 2021-11-23 DIAGNOSIS — G061 Intraspinal abscess and granuloma: Secondary | ICD-10-CM | POA: Diagnosis not present

## 2021-11-23 DIAGNOSIS — J90 Pleural effusion, not elsewhere classified: Secondary | ICD-10-CM | POA: Diagnosis not present

## 2021-11-23 LAB — RESP PANEL BY RT-PCR (FLU A&B, COVID) ARPGX2
Influenza A by PCR: NEGATIVE
Influenza B by PCR: NEGATIVE
SARS Coronavirus 2 by RT PCR: NEGATIVE

## 2021-11-23 NOTE — Progress Notes (Signed)
Pulmonary Critical Care Medicine [facility] [place]   PULMONARY CRITICAL CARE SERVICE  PROGRESS NOTE     Lori Kemp  EHU:[id number]  DOB: April 30, 1956   DOA: 10/28/2021  Referring Physician: Luna Kitchens, MD  HPI: Lori Kemp is a 66 y.o. female being followed for ventilator/airway/oxygen weaning Acute on Chronic Respiratory Failure.  Patient is comfortable without distress at this time has been on T-piece the goal is for 8 hours today  Medications: Reviewed on Rounds  Physical Exam:  Vitals: Temperature is 96.9 pulse 96 respiratory 16 blood pressure is 161/88 saturations 100%  Ventilator Settings on T-piece FiO2 is 30%  General: Comfortable at this time Neck: supple Cardiovascular: no malignant arrhythmias Respiratory: No rhonchi very coarse breath sounds Skin: no rash seen on limited exam Musculoskeletal: No gross abnormality Psychiatric:unable to assess Neurologic:no involuntary movements         Lab Data:   Basic Metabolic Panel: Recent Labs  Lab 11/18/21 0432 11/19/21 0143 11/20/21 0915 11/21/21 0346  NA 143 144  --  145  K 5.5* 5.1 5.2* 5.0  CL 97* 97*  --  96*  CO2 38* 40*  --  42*  GLUCOSE 115* 138*  --  136*  BUN 63* 60*  --  58*  CREATININE 0.73 0.71  --  0.76  CALCIUM 9.3 9.4  --  9.5  MG  --   --   --  1.9  PHOS  --   --   --  4.2    ABG: Recent Labs  Lab 11/16/21 1010 11/20/21 1055  PHART 7.351 7.343*  PCO2ART 75.0* 79.4*  PO2ART 94.8 85.2  HCO3 40.5* 42.3*  O2SAT 97.7 96.7    Liver Function Tests: Recent Labs  Lab 11/21/21 0346  AST 21  ALT 22  ALKPHOS 81  BILITOT 0.2*  PROT 5.9*  ALBUMIN 1.8*   No results for input(s): LIPASE, AMYLASE in the last 168 hours. No results for input(s): AMMONIA in the last 168 hours.  CBC: Recent Labs  Lab 11/18/21 0432 11/19/21 0143 11/21/21 0346  WBC 5.0 5.0 6.7  HGB 8.8* 9.1* 9.3*  HCT 29.3* 29.5* 31.0*  MCV 98.0 98.0 97.5  PLT 185 187 187    Cardiac  Enzymes: No results for input(s): CKTOTAL, CKMB, CKMBINDEX, TROPONINI in the last 168 hours.  BNP (last 3 results) No results for input(s): BNP in the last 8760 hours.  ProBNP (last 3 results) No results for input(s): PROBNP in the last 8760 hours.  Radiological Exams: CT ANGIO HEAD NECK W WO CM  Addendum Date: 11/21/2021   ADDENDUM REPORT: 11/21/2021 17:56 ADDENDUM: 6. Fluid in the bilateral mastoid air cells and middle ears, which can be seen in the setting of otomastoiditis or otitis media. Correlate with symptoms. Electronically Signed   By: Wiliam Ke M.D.   On: 11/21/2021 17:56   Result Date: 11/21/2021 CLINICAL DATA:  History of vertebral artery dissection EXAM: CT ANGIOGRAPHY HEAD AND NECK TECHNIQUE: Multidetector CT imaging of the head and neck was performed using the standard protocol during bolus administration of intravenous contrast. Multiplanar CT image reconstructions and MIPs were obtained to evaluate the vascular anatomy. Carotid stenosis measurements (when applicable) are obtained utilizing NASCET criteria, using the distal internal carotid diameter as the denominator. RADIATION DOSE REDUCTION: This exam was performed according to the departmental dose-optimization program which includes automated exposure control, adjustment of the mA and/or kV according to patient size and/or use of iterative reconstruction technique. CONTRAST:  22mL  OMNIPAQUE IOHEXOL 350 MG/ML SOLN COMPARISON:  MRA neck 11/08/2021; no prior CTA is available for comparison. FINDINGS: CT HEAD FINDINGS Brain: No evidence of acute infarction, hemorrhage, cerebral edema, mass, mass effect, or midline shift. No hydrocephalus or extra-axial fluid collection. Periventricular white matter changes, likely the sequela of chronic small vessel ischemic disease. Vascular: No hyperdense vessel. Skull: Status post prior cervical and occipital fusion. Negative for fracture or focal lesion. Sinuses/Orbits: Mucosal thickening in  the right sphenoid sinus and right maxillary sinus. The orbits are unremarkable. Other: The mastoid air cells are completely opacified bilaterally, with fluid in the bilateral middle ears CTA NECK FINDINGS Aortic arch: Standard branching. Imaged portion shows no evidence of aneurysm or dissection. No significant stenosis of the major arch vessel origins. Aortic atherosclerosis, which extends into the branch vessels, without hemodynamically significant stenosis. Right carotid system: Irregularity of the right common carotid artery, likely in part secondary to right neck dissection. The right internal carotid artery is patent, without significant stenosis, dissection, or occlusion. Left carotid system: No evidence of dissection, stenosis (50% or greater) or occlusion. Vertebral arteries: Diminutive right V1, with occlusion of the V2 segment at the level C5 and reconstitution at the C2-C3 level. Focal narrowing in the distal V2 and proximal V3 segment. The left vertebral artery is patent from its origin to the level of C2-C3, with distal reconstitution in the V3 segment. The vertebral arteries appear similar to the 11/08/2021 MRA, when accounting for differences in technique. Skeleton: Status post prior C2-C7 posterior decompression and fusion. Possible malposition of the disc spacer at C3-C4, which extends past the anterior margin of the vertebral bodies. Chronic appearing fractures of C1 and C2, with basilar impression. Other neck: Status post tracheostomy. Sequela of prior right neck dissection no acute process in the neck. Upper chest: Small bilateral pleural effusions with associated atelectasis. Emphysema. Review of the MIP images confirms the above findings CTA HEAD FINDINGS Anterior circulation: Both internal carotid arteries are patent to the termini, without significant stenosis. A1 segments patent. Normal anterior communicating artery. Anterior cerebral arteries are patent to their distal aspects. No M1  stenosis or occlusion. Normal MCA bifurcations. Distal MCA branches perfused and symmetric. Posterior circulation: Vertebral arteries patent to the vertebrobasilar junction without stenosis. Posterior inferior cerebral arteries patent bilaterally. Basilar patent to its distal aspect. Superior cerebellar arteries patent bilaterally. Patent P1 segments. PCAs perfused to their distal aspects without stenosis. The bilateral posterior communicating arteries are patent. Venous sinuses: As permitted by contrast timing, patent. Anatomic variants: None significant. Review of the MIP images confirms the above findings IMPRESSION: 1. 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 2. No other significant stenosis in the neck. 3. No intracranial large vessel occlusion or significant stenosis. 4. Status post prior C2-C7 posterior decompression and fusion. The disc spacer at C3-C4 appears to extend past the anterior margin of the vertebral bodies, of indeterminate acuity. 5. Small bilateral pleural effusions associated atelectasis. Electronically Signed: By: Wiliam Ke M.D. On: 11/21/2021 17:50    Assessment/Plan Active Problems:   Acute on chronic respiratory failure with hypoxia (HCC)   Abscess in epidural space of cervical spine   Tracheostomy status (HCC)   COPD, severe (HCC)   Bilateral pleural effusion   Acute on chronic respiratory failure hypoxia plan  is to wean on the T-piece for 8 hours as discussed Epidural abscess supportive care we will continue to monitor Severe COPD nebulizers as needed Bilateral effusions there are small effusions noted at this point with some underlying atelectasis Tracheostomy remains in place   I have personally seen and evaluated the  patient, evaluated laboratory and imaging results, formulated the assessment and plan and placed orders. The Patient requires high complexity decision making with multiple systems involvement.  Rounds were done with the Respiratory Therapy Director and Staff therapists and discussed with nursing staff also.  Yevonne Pax, MD [facility] Pulmonary Critical Care Medicine Sleep Medicine

## 2021-11-24 ENCOUNTER — Ambulatory Visit (HOSPITAL_COMMUNITY)
Admission: AD | Admit: 2021-11-24 | Discharge: 2021-11-24 | Disposition: A | Discharge status: Home / Self Care | Payer: Medicare Other | Source: Other Acute Inpatient Hospital | Attending: Internal Medicine | Admitting: Internal Medicine

## 2021-11-24 DIAGNOSIS — J9690 Respiratory failure, unspecified, unspecified whether with hypoxia or hypercapnia: Secondary | ICD-10-CM | POA: Insufficient documentation

## 2021-11-29 LAB — MISC LABCORP TEST (SEND OUT): Labcorp test code: 9985

## 2021-12-05 ENCOUNTER — Encounter (HOSPITAL_COMMUNITY): Payer: Self-pay | Admitting: Radiology

## 2022-05-09 DEATH — deceased
